# Patient Record
Sex: Male | Born: 1935 | Race: White | Hispanic: No | Smoking: Former smoker
Health system: Southern US, Community
[De-identification: ages and names within clinical notes are randomized; demographics above are authoritative.]

## PROBLEM LIST (undated history)

## (undated) DIAGNOSIS — I714 Abdominal aortic aneurysm, without rupture, unspecified: Secondary | ICD-10-CM

## (undated) DIAGNOSIS — R269 Unspecified abnormalities of gait and mobility: Secondary | ICD-10-CM

## (undated) DIAGNOSIS — Z951 Presence of aortocoronary bypass graft: Secondary | ICD-10-CM

## (undated) DIAGNOSIS — I10 Essential (primary) hypertension: Secondary | ICD-10-CM

## (undated) DIAGNOSIS — I639 Cerebral infarction, unspecified: Secondary | ICD-10-CM

## (undated) DIAGNOSIS — I509 Heart failure, unspecified: Secondary | ICD-10-CM

## (undated) DIAGNOSIS — E785 Hyperlipidemia, unspecified: Secondary | ICD-10-CM

## (undated) DIAGNOSIS — R413 Other amnesia: Secondary | ICD-10-CM

## (undated) DIAGNOSIS — K219 Gastro-esophageal reflux disease without esophagitis: Secondary | ICD-10-CM

## (undated) DIAGNOSIS — K319 Disease of stomach and duodenum, unspecified: Secondary | ICD-10-CM

## (undated) DIAGNOSIS — I2510 Atherosclerotic heart disease of native coronary artery without angina pectoris: Secondary | ICD-10-CM

## (undated) DIAGNOSIS — N189 Chronic kidney disease, unspecified: Secondary | ICD-10-CM

## (undated) DIAGNOSIS — M109 Gout, unspecified: Secondary | ICD-10-CM

## (undated) DIAGNOSIS — C801 Malignant (primary) neoplasm, unspecified: Secondary | ICD-10-CM

## (undated) DIAGNOSIS — M1990 Unspecified osteoarthritis, unspecified site: Secondary | ICD-10-CM

## (undated) DIAGNOSIS — I350 Nonrheumatic aortic (valve) stenosis: Secondary | ICD-10-CM

## (undated) HISTORY — DX: Abdominal aortic aneurysm, without rupture: I71.4

## (undated) HISTORY — DX: Presence of aortocoronary bypass graft: Z95.1

## (undated) HISTORY — DX: Abdominal aortic aneurysm, without rupture, unspecified: I71.40

## (undated) HISTORY — DX: Heart failure, unspecified: I50.9

## (undated) HISTORY — DX: Malignant (primary) neoplasm, unspecified: C80.1

## (undated) HISTORY — DX: Chronic kidney disease, unspecified: N18.9

## (undated) HISTORY — DX: Essential (primary) hypertension: I10

## (undated) HISTORY — DX: Gastro-esophageal reflux disease without esophagitis: K21.9

## (undated) HISTORY — DX: Gout, unspecified: M10.9

## (undated) HISTORY — DX: Unspecified osteoarthritis, unspecified site: M19.90

## (undated) HISTORY — DX: Atherosclerotic heart disease of native coronary artery without angina pectoris: I25.10

## (undated) HISTORY — DX: Cerebral infarction, unspecified: I63.9

## (undated) HISTORY — DX: Nonrheumatic aortic (valve) stenosis: I35.0

## (undated) HISTORY — DX: Hyperlipidemia, unspecified: E78.5

## (undated) HISTORY — DX: Disease of stomach and duodenum, unspecified: K31.9

## (undated) HISTORY — PX: ABDOMINAL AORTIC ANEURYSM REPAIR: SUR1152

## (undated) HISTORY — DX: Other amnesia: R41.3

## (undated) HISTORY — DX: Unspecified abnormalities of gait and mobility: R26.9

---

## 1992-03-06 HISTORY — PX: CORONARY ARTERY BYPASS GRAFT: SHX141

## 2008-03-06 HISTORY — PX: PROSTATECTOMY: SHX69

## 2009-03-06 HISTORY — PX: BYPASS GRAFT: SHX909

## 2010-03-06 HISTORY — PX: REPLACEMENT TOTAL KNEE: SUR1224

## 2012-03-06 HISTORY — PX: HERNIA REPAIR: SHX51

## 2012-10-10 ENCOUNTER — Other Ambulatory Visit: Payer: Self-pay | Admitting: *Deleted

## 2012-10-10 ENCOUNTER — Ambulatory Visit (INDEPENDENT_AMBULATORY_CARE_PROVIDER_SITE_OTHER): Payer: Medicare Other | Admitting: Cardiovascular Disease

## 2012-10-10 ENCOUNTER — Encounter: Payer: Self-pay | Admitting: Cardiovascular Disease

## 2012-10-10 VITALS — BP 118/74 | HR 62 | Ht 72.0 in | Wt 194.0 lb

## 2012-10-10 DIAGNOSIS — E785 Hyperlipidemia, unspecified: Secondary | ICD-10-CM

## 2012-10-10 DIAGNOSIS — Z9889 Other specified postprocedural states: Secondary | ICD-10-CM

## 2012-10-10 DIAGNOSIS — Z951 Presence of aortocoronary bypass graft: Secondary | ICD-10-CM

## 2012-10-10 DIAGNOSIS — I714 Abdominal aortic aneurysm, without rupture: Secondary | ICD-10-CM

## 2012-10-10 DIAGNOSIS — Z954 Presence of other heart-valve replacement: Secondary | ICD-10-CM

## 2012-10-10 DIAGNOSIS — I712 Thoracic aortic aneurysm, without rupture: Secondary | ICD-10-CM

## 2012-10-10 DIAGNOSIS — Z01810 Encounter for preprocedural cardiovascular examination: Secondary | ICD-10-CM

## 2012-10-10 DIAGNOSIS — Z952 Presence of prosthetic heart valve: Secondary | ICD-10-CM

## 2012-10-10 DIAGNOSIS — Z8679 Personal history of other diseases of the circulatory system: Secondary | ICD-10-CM | POA: Insufficient documentation

## 2012-10-10 DIAGNOSIS — I10 Essential (primary) hypertension: Secondary | ICD-10-CM

## 2012-10-10 DIAGNOSIS — Z01812 Encounter for preprocedural laboratory examination: Secondary | ICD-10-CM

## 2012-10-10 DIAGNOSIS — I70269 Atherosclerosis of native arteries of extremities with gangrene, unspecified extremity: Secondary | ICD-10-CM

## 2012-10-10 NOTE — Assessment & Plan Note (Signed)
Recent abdominal aortic areas measurements by CT scan suggest that it has grown over 2 cm in last 2 years now but over 7 cm in diameter. I'm going to refer him to Dr. Durene Cal for consideration of endoluminal stent grafting. The patient does not wish to have open aortobifemoral bypass grafting. He'll need a preoperative cardiovascular workup for clearance.

## 2012-10-10 NOTE — Progress Notes (Signed)
10/10/2012 Robert Mueller.   03-24-1931  [id number]  Primary Physician No primary provider on file. Primary Cardiologist: Runell Gess MD Roseanne Reno   HPI:  [name] is a 79 year old recently widowed Caucasian male (wife for 58 years recently passed away) father of 49 daughter-in-law of Lacinda Axon, one of my patients. The patient recently moved from [place] to live with his family. He is here to be established to my practice. He has a history of coronary artery bypass grafting in 1992 at [facility] in [place] with redo bypass grafting December 2011 at [facility] in [place]. He probably had an aortic valve replacement done simultaneously as well as a thoracic aortic aneurysm repair. His affect is include remote tobacco abuse having quit in 1992, hypertension and hyperlipidemia. He is a retired Hotel manager. He denies chest pain or shortness of breath. He has a large abdominal aortic areas and which has been followed in the past noninvasively currently measuring 7.5 cm by CT scanning.   Current Outpatient Prescriptions  Medication Sig Dispense Refill  . allopurinol (ZYLOPRIM) 100 MG tablet Take 100 mg by mouth daily.      Marland Kitchen amLODipine (NORVASC) 10 MG tablet Take 10 mg by mouth daily.      Marland Kitchen aspirin EC 81 MG tablet Take 81 mg by mouth daily.      Marland Kitchen atorvastatin (LIPITOR) 20 MG tablet Take 20 mg by mouth daily. Take 30mg  daily      . FLUoxetine (PROZAC) 10 MG capsule Take 10 mg by mouth daily.      Marland Kitchen lisinopril (PRINIVIL,ZESTRIL) 20 MG tablet Take 20 mg by mouth daily.      . multivitamin-lutein (OCUVITE-LUTEIN) CAPS capsule Take 1 capsule by mouth daily.      . pantoprazole (PROTONIX) 40 MG tablet Take 40 mg by mouth daily.      Marland Kitchen zolpidem (AMBIEN) 5 MG tablet Take 5 mg by mouth at bedtime as needed for sleep.       No current facility-administered medications for this visit.    Allergies  Allergen  Reactions  . Contrast Media (Iodinated Diagnostic Agents)   . Penicillins     Joint swelling    History   Social History  . Marital Status: Widowed    Spouse Name: N/A    Number of Children: N/A  . Years of Education: N/A   Occupational History  . Not on file.   Social History Main Topics  . Smoking status: Former Smoker -- 0.75 packs/day for 37 years    Types: Cigarettes    Quit date: 03/06/1990  . Smokeless tobacco: Not on file  . Alcohol Use: 7.0 oz/week    14 drink(s) per week  . Drug Use: No  . Sexually Active: Not on file   Other Topics Concern  . Not on file   Social History Narrative  . No narrative on file     Review of Systems: General: negative for chills, fever, night sweats or weight changes.  Cardiovascular: negative for chest pain, dyspnea on exertion, edema, orthopnea, palpitations, paroxysmal nocturnal dyspnea or shortness of breath Dermatological: negative for rash Respiratory: negative for cough or wheezing Urologic: negative for hematuria Abdominal: negative for nausea, vomiting, diarrhea, bright red blood per rectum, melena, or hematemesis Neurologic: negative for visual changes, syncope, or dizziness All other systems reviewed and are otherwise negative except as noted above.    Blood pressure 118/74, pulse 62, height 6' (1.829 m), weight  194 lb (87.998 kg).  General appearance: alert and no distress Neck: no adenopathy, no carotid bruit, no JVD, supple, symmetrical, trachea midline and thyroid not enlarged, symmetric, no tenderness/mass/nodules Lungs: clear to auscultation bilaterally Heart: regular rate and rhythm, S1, S2 normal, no murmur, click, rub or gallop Extremities: extremities normal, atraumatic, no cyanosis or edema Pulses: 2+ and symmetric  EKG normal sinus rhythm at 62 without ST or T wave changes  ASSESSMENT AND PLAN:   S/P CABG (coronary artery bypass graft) History of coronary artery bypass grafting at [facility] in 1992 with redo December 2011 at [facility] in [facility] with aortic valve replacement and thoracic aortic aneurysm repair. The patient denies chest pain or shortness of breath. He is never had a heart attack.  Abdominal aortic aneurysm Recent abdominal aortic areas measurements by CT scan suggest that it has grown over 2 cm in last 2 years now but over 7 cm in diameter. I'm going to refer him to Dr. Durene Cal for consideration of endoluminal stent grafting. The patient does not wish to have open aortobifemoral bypass grafting. He'll need a preoperative cardiovascular workup for clearance.      Runell Gess MD FACP,FACC,FAHA, [facility] 10/10/2012 1:15 PM

## 2012-10-10 NOTE — Assessment & Plan Note (Signed)
History of coronary artery bypass grafting at [facility] in 1992 with redo December 2011 at [facility] in [place] with aortic valve replacement and thoracic aortic aneurysm repair. The patient denies chest pain or shortness of breath. He is never had a heart attack.

## 2012-10-10 NOTE — Patient Instructions (Signed)
  We will see you back in follow up in 3 months with Dr Allyson Sabal  Dr Allyson Sabal has ordered an echocardiogram and a lexiscan myoview Your physician has requested that you have an echocardiogram. Echocardiography is a painless test that uses sound waves to create images of your heart. It provides your doctor with information about the size and shape of your heart and how well your heart's chambers and valves are working. This procedure takes approximately one hour. There are no restrictions for this procedure.  Your physician has requested that you have a lexiscan myoview. For further information please visit [email or web address] Please follow instruction sheet, as given.  We have referred you to Dr Myra Gianotti for evaluation of your AAA

## 2012-10-11 ENCOUNTER — Encounter: Payer: Self-pay | Admitting: Surgery

## 2012-10-14 ENCOUNTER — Ambulatory Visit (HOSPITAL_COMMUNITY): Payer: Medicare Other

## 2012-10-14 ENCOUNTER — Ambulatory Visit (INDEPENDENT_AMBULATORY_CARE_PROVIDER_SITE_OTHER): Payer: Medicare Other | Admitting: Surgery

## 2012-10-14 ENCOUNTER — Other Ambulatory Visit: Payer: Self-pay | Admitting: Surgery

## 2012-10-14 ENCOUNTER — Encounter: Payer: 59 | Admitting: Surgery

## 2012-10-14 ENCOUNTER — Encounter: Payer: Self-pay | Admitting: Surgery

## 2012-10-14 VITALS — BP 146/94 | HR 66 | Resp 16 | Ht 72.0 in | Wt 196.0 lb

## 2012-10-14 DIAGNOSIS — I714 Abdominal aortic aneurysm, without rupture, unspecified: Secondary | ICD-10-CM | POA: Insufficient documentation

## 2012-10-14 NOTE — Progress Notes (Signed)
[facility] [place]   Patient name: Robert Mueller. MRN: [id number] DOB: March 04, 1932 Sex: male   Referred by: Dr Robert Mueller  Reason for referral:  Chief Complaint  Patient presents with  . New Evaluation    7.3 to 7.5 cm  AAA Ref. by Dr. Erlene Mueller    HISTORY OF PRESENT ILLNESS: This is a 79 year old gentleman who recently moved from [place] to this area. He has a known a large juxtarenal abdominal aortic aneurysm. He comes in today for further evaluation. He brought a CAT scan from [place] which was a noncontrasted study which shows juxtarenal aneurysm. Apparently, this has grown 2 cm in the past 2 years and is now greater than 7 cm in diameter. He denies specific abdominal pain or back pain. In the past, he refused open surgery. He is now amenable to getting this taken care of because of how large it has gotten.  The patient has a significant cardiac history. He is undergone CABG at [facility] in 1992. 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 He underwent redo surgery in 2011 in [place] with aortic valve replacement and a descending aortic aneurysm repair.  The patient suffers from hypertension. He is on an ACE inhibitor. He is also managed for hypercholesterolemia with a statin. The patient does have a contrast allergy was consistent rash and joint pain.  Past Medical History  Diagnosis Date  . Coronary artery disease   . S/P CABG (coronary artery bypass graft)     x 2  . Hypertension   . Hyperlipidemia   . Abdominal aortic aneurysm     7.3 cm x 7.4 cm  . Stroke   . Cancer     prostate  . Stomach problems   . Arthritis     Gout  . GERD (gastroesophageal reflux disease)   . Mild aortic stenosis   . CHF (congestive heart failure)   . Chronic kidney disease     Stage II    Past Surgical History  Procedure Laterality Date  . Hernia repair      4  . Replacement total knee    . Prostatectomy    . Coronary artery bypass  graft    . Bypass graft      History   Social History  . Marital Status: Widowed    Spouse Name: N/A    Number of Children: 3  . Years of Education: 12   Occupational History  . Not on file.   Social History Main Topics  . Smoking status: Former Smoker -- 0.75 packs/day for 37 years    Types: Cigars    Quit date: 03/06/1988  . Smokeless tobacco: Former Neurosurgeon    Types: Chew  . Alcohol Use: 0.0 oz/week     Comment: 14-20 DRINKS WEEKLY OF GIN / SCOTCH  . Drug Use: No  . Sexually Active: Not on file   Other Topics Concern  . Not on file   Social History Narrative   LIVES WITH HIS DAUGHTER AND SON-IN-LAW   WALKS WITH A WALKER/CANE   DOES NOT CLEAN HOUSE   DOES NOT DO YARD WORK      DOES DO GROCERY SHOPPING    Family History  Problem Relation Age of Onset  . Cancer Mother   . Cirrhosis Neg Hx     family member passed away at 40    Allergies as of 10/14/2012 - Review Complete 10/14/2012  Allergen Reaction Noted  . Contrast  media (iodinated diagnostic agents)  10/10/2012  . Penicillins  10/10/2012    Current Outpatient Prescriptions on File Prior to Visit  Medication Sig Dispense Refill  . allopurinol (ZYLOPRIM) 100 MG tablet Take 100 mg by mouth daily.      Marland Kitchen amLODipine (NORVASC) 10 MG tablet Take 10 mg by mouth daily.      Marland Kitchen aspirin EC 81 MG tablet Take 81 mg by mouth daily.      Marland Kitchen atorvastatin (LIPITOR) 20 MG tablet Take 20 mg by mouth daily. Take 30mg  daily      . FLUoxetine (PROZAC) 10 MG capsule Take 10 mg by mouth daily.      Marland Kitchen lisinopril (PRINIVIL,ZESTRIL) 20 MG tablet Take 20 mg by mouth daily.      . multivitamin-lutein (OCUVITE-LUTEIN) CAPS capsule Take 1 capsule by mouth daily.      . pantoprazole (PROTONIX) 40 MG tablet Take 40 mg by mouth daily.      Marland Kitchen zolpidem (AMBIEN) 5 MG tablet Take 5 mg by mouth at bedtime as needed for sleep.       No current facility-administered medications on file prior to visit.     REVIEW OF SYSTEMS: Cardiovascular:  No chest pain, chest pressure, palpitations, orthopnea, or dyspnea on exertion. No claudication or rest pain, positive for occasional leg swelling Pulmonary: No productive cough, asthma or wheezing. Neurologic: No weakness, paresthesias, aphasia, or amaurosis. Occasional  dizziness. Hematologic: No bleeding problems or clotting disorders. Musculoskeletal: No joint pain or joint swelling. Gastrointestinal: No blood in stool or hematemesis Genitourinary: No dysuria or hematuria. Psychiatric:: No history of major depression. Integumentary: No rashes or ulcers. Constitutional: No fever or chills.  PHYSICAL EXAMINATION: General: The patient appears their stated age.  Vital signs are BP 146/94  Pulse 66  Resp 16  Ht 6' (1.829 m)  Wt 196 lb (88.905 kg)  BMI 26.58 kg/m2  SpO2 95% HEENT:  No gross abnormalities Pulmonary: Respirations are non-labored Abdomen: Soft and non-tender . Aorta is palpable. It is mildly tender Musculoskeletal: There are no major deformities.   Neurologic: No focal weakness or paresthesias are detected, Skin: There are no ulcer or rashes noted. Psychiatric: The patient has normal affect. Cardiovascular: There is a regular rate and rhythm without significant murmur appreciated. I cannot palpate pedal pulses  Diagnostic Studies: I have reviewed his outside CT scan. It is difficult to ascertain much on his aneurysm from his most recent scan because of the lack of contrast   Assessment:  Large abdominal aortic aneurysm Plan: I spent approximately 45 minutes discussing our options with the patient and his daughter. The patient does have a juxtarenal aneurysm and need either a fenestrated stent graft for an open repair. I am unsure whether or not the ZFEN device, which is the only device I had access to will treat his aneurysm. He needs a CT angiogram to help better make this decision. I also offered them the ability to go to [facility] to have this better evaluated as  they may be able to get a device created earlier than I can. From the ED turnaround time is approximately 5 weeks. We have decided to premedicate him for his dialysis he and get a CT angiogram tomorrow. I will contact them with the results and our plan.     Jorge Ny, M.D. [facility] [place] Office: [phone] Pager:  [phone]

## 2012-10-15 ENCOUNTER — Inpatient Hospital Stay: Admission: RE | Admit: 2012-10-15 | Payer: Medicare Other | Source: Ambulatory Visit

## 2012-10-15 ENCOUNTER — Ambulatory Visit
Admission: RE | Admit: 2012-10-15 | Discharge: 2012-10-15 | Disposition: A | Discharge status: Home / Self Care | Payer: Medicare Other | Source: Ambulatory Visit | Attending: Surgery | Admitting: Surgery

## 2012-10-15 DIAGNOSIS — I712 Thoracic aortic aneurysm, without rupture: Secondary | ICD-10-CM

## 2012-10-15 DIAGNOSIS — Z01810 Encounter for preprocedural cardiovascular examination: Secondary | ICD-10-CM

## 2012-10-15 DIAGNOSIS — I714 Abdominal aortic aneurysm, without rupture: Secondary | ICD-10-CM

## 2012-10-15 MED ORDER — IOHEXOL 350 MG/ML SOLN
100.0000 mL | Freq: Once | INTRAVENOUS | Status: AC | PRN
  Administered 2012-10-15: 100 mL via INTRAVENOUS

## 2012-10-17 ENCOUNTER — Ambulatory Visit (HOSPITAL_COMMUNITY)
Admission: RE | Admit: 2012-10-17 | Discharge: 2012-10-17 | Disposition: A | Discharge status: Home / Self Care | Payer: Medicare Other | Source: Ambulatory Visit | Attending: Cardiovascular Disease | Admitting: Cardiovascular Disease

## 2012-10-17 ENCOUNTER — Ambulatory Visit (HOSPITAL_BASED_OUTPATIENT_CLINIC_OR_DEPARTMENT_OTHER)
Admission: RE | Admit: 2012-10-17 | Discharge: 2012-10-17 | Disposition: A | Discharge status: Home / Self Care | Payer: Medicare Other | Source: Ambulatory Visit | Attending: Cardiovascular Disease | Admitting: Cardiovascular Disease

## 2012-10-17 ENCOUNTER — Encounter: Payer: Self-pay | Admitting: Cardiovascular Disease

## 2012-10-17 DIAGNOSIS — I10 Essential (primary) hypertension: Secondary | ICD-10-CM | POA: Insufficient documentation

## 2012-10-17 DIAGNOSIS — I712 Thoracic aortic aneurysm, without rupture, unspecified: Secondary | ICD-10-CM | POA: Insufficient documentation

## 2012-10-17 DIAGNOSIS — Z952 Presence of prosthetic heart valve: Secondary | ICD-10-CM

## 2012-10-17 DIAGNOSIS — I079 Rheumatic tricuspid valve disease, unspecified: Secondary | ICD-10-CM | POA: Insufficient documentation

## 2012-10-17 DIAGNOSIS — Z01810 Encounter for preprocedural cardiovascular examination: Secondary | ICD-10-CM | POA: Insufficient documentation

## 2012-10-17 DIAGNOSIS — I359 Nonrheumatic aortic valve disorder, unspecified: Secondary | ICD-10-CM

## 2012-10-17 DIAGNOSIS — I2510 Atherosclerotic heart disease of native coronary artery without angina pectoris: Secondary | ICD-10-CM

## 2012-10-17 DIAGNOSIS — E785 Hyperlipidemia, unspecified: Secondary | ICD-10-CM | POA: Insufficient documentation

## 2012-10-17 DIAGNOSIS — Z951 Presence of aortocoronary bypass graft: Secondary | ICD-10-CM

## 2012-10-17 DIAGNOSIS — I379 Nonrheumatic pulmonary valve disorder, unspecified: Secondary | ICD-10-CM | POA: Insufficient documentation

## 2012-10-17 MED ORDER — TECHNETIUM TC 99M SESTAMIBI GENERIC - CARDIOLITE
31.2000 | Freq: Once | INTRAVENOUS | Status: AC | PRN
  Administered 2012-10-17: 31.2 via INTRAVENOUS

## 2012-10-17 MED ORDER — TECHNETIUM TC 99M SESTAMIBI GENERIC - CARDIOLITE
10.5000 | Freq: Once | INTRAVENOUS | Status: AC | PRN
  Administered 2012-10-17: 11 via INTRAVENOUS

## 2012-10-17 MED ORDER — REGADENOSON 0.4 MG/5ML IV SOLN
0.4000 mg | Freq: Once | INTRAVENOUS | Status: AC
  Administered 2012-10-17: 0.4 mg via INTRAVENOUS

## 2012-10-17 NOTE — Procedures (Addendum)
[facility] [address] [place] [address] [phone]  Cardiology Nuclear Med Study  Robert Mueller. is a 79 y.o. male     MRN : [id number]     DOB: April 24, 1931  Procedure Date: 10/17/2012  Nuclear Med Background Indication for Stress Test:  Surgical Clearance and Graft Patency History:  CABG 1992, 2011 Cardiac Risk Factors: History of Smoking, Hypertension, Lipids, Obesity and PVD  Symptoms:  Fatigue   Nuclear Pre-Procedure Caffeine/Decaff Intake:  7:00pm NPO After: 5:30am   IV Site: R Antecubital  IV 0.9% NS with Angio Cath:  22g  Chest Size (in):  42 IV Started by: Koren Shiver, CNMT  Height: 6' (1.829 m)  Cup Size: n/a  BMI:  Body mass index is 26.31 kg/(m^2). Weight:  194 lb (87.998 kg)   Tech Comments:  n/a    Nuclear Med Study 1 or 2 day study: 1 day  Stress Test Type:  Lexiscan  Order Authorizing Provider:  Nanetta Batty, MD   Resting Radionuclide: Technetium 82m Sestamibi  Resting Radionuclide Dose: 10.5 mCi   Stress Radionuclide:  Technetium 68m Sestamibi  Stress Radionuclide Dose: 31.2 mCi           Stress Protocol Rest HR: 55 Stress HR:  58  Rest BP:  138/86 Stress BP:  123/87  Exercise Time (min): n/a METS: n/a   Predicted Max HR: 141 bpm % Max HR: 46.1 bpm Rate Pressure Product: 86578  Dose of Adenosine (mg):  n/a Dose of Lexiscan: 0.4 mg  Dose of Atropine (mg): n/a Dose of Dobutamine: n/a mcg/kg/min (at max HR)  Stress Test Technologist: Esperanza Sheets, CCT Nuclear Technologist: Gonzella Lex, CNMT   Rest Procedure:  Myocardial perfusion imaging was performed at rest 45 minutes following the intravenous administration of Technetium 65m Sestamibi. Stress Procedure:  The patient received IV Lexiscan 0.4 mg over 15-seconds.  Technetium 86m Sestamibi injected at 30-seconds.  There were no significant changes with Lexiscan.  Quantitative spect images were obtained after a 45 minute  delay.  Transient Ischemic Dilatation (Normal <1.22):  0.89 Lung/Heart Ratio (Normal <0.45):  0.21 QGS EDV:  99 ml QGS ESV:  44 ml LV Ejection Fraction: 55%     Rest ECG: NSR with non-specific ST-T wave changes  Stress ECG: There are scattered PVCs.  QPS Raw Data Images:  Normal; no motion artifact; normal heart/lung ratio. Stress Images:  There is decreased uptake in the inferior wall and apex. Rest Images:  There is decreased uptake in the inferior wall and apex. Subtraction (SDS):  There is a fixed defect that is most consistent with a previous infarction.  Impression Exercise Capacity:  Lexiscan with no exercise. BP Response:  Normal blood pressure response. Clinical Symptoms:  No significant symptoms noted. ECG Impression:  No significant ST segment change suggestive of ischemia. Comparison with Prior Nuclear Study: No previous nuclear study performed  Overall Impression:  Low risk stress nuclear study : inferior and apical scar without reversible ischemia.  LV Wall Motion:  Normal overall LV function with inferior hypokinesis and apical akinesis.   Thurmon Fair, MD  10/17/2012 12:36 PM

## 2012-10-17 NOTE — Progress Notes (Signed)
2D Echo Performed 10/17/2012    Erie Radu, RCS  

## 2012-10-21 ENCOUNTER — Encounter: Payer: Self-pay | Admitting: Cardiovascular Disease

## 2012-10-21 NOTE — Telephone Encounter (Signed)
Message forwarded to Medical Records to verify documents and contact pt to confirm.

## 2012-10-21 NOTE — Telephone Encounter (Signed)
Message forwarded to Medical Records to update info.

## 2012-10-23 ENCOUNTER — Encounter: Payer: Self-pay | Admitting: Cardiovascular Disease

## 2012-10-23 ENCOUNTER — Telehealth: Payer: Self-pay | Admitting: *Deleted

## 2012-10-23 NOTE — Telephone Encounter (Signed)
Call to pt and spoke w/ Tinnie Gens, pt's daughter.  Informed CT results have not been reviewed by Dr. Allyson Sabal and his nurse will be notified to release results in MyChart once reviewed.  Also informed no record of referral to [facility] and that pt was referred to Dr. Myra Gianotti.  Verbalized understanding.  Stated Dr. Myra Gianotti referred pt to a doctor in [facility] and they called to set up appt, but never gave phone number or address.  Contact number for Dr. Myra Gianotti given [phone]) and advised she contact his office for information r/t referral.  Verbalized understanding and agreed w/ plan.  Message forwarded to K. Petra Kuba, Charity fundraiser.

## 2012-10-23 NOTE — Telephone Encounter (Signed)
Error. See My Chart message

## 2012-10-24 DIAGNOSIS — I716 Thoracoabdominal aortic aneurysm, without rupture: Secondary | ICD-10-CM | POA: Insufficient documentation

## 2012-10-25 ENCOUNTER — Encounter: Payer: Self-pay | Admitting: Cardiovascular Disease

## 2012-10-29 ENCOUNTER — Telehealth: Payer: Self-pay | Admitting: *Deleted

## 2012-10-29 NOTE — Telephone Encounter (Signed)
I called and spoke with Jenne.  Results to [facility] and echo given.  Mr Wyss is in a study for his AAA graft at [facility].  I faxed myoview and echo results to Diane [phone], [phone], email [email or web address]

## 2013-01-09 ENCOUNTER — Other Ambulatory Visit: Payer: Self-pay

## 2013-02-24 ENCOUNTER — Emergency Department (HOSPITAL_COMMUNITY)
Admission: EM | Admit: 2013-02-24 | Discharge: 2013-02-24 | Disposition: A | Discharge status: Home / Self Care | Payer: Medicare Other | Attending: Emergency Medicine | Admitting: Emergency Medicine

## 2013-02-24 ENCOUNTER — Encounter (HOSPITAL_COMMUNITY): Payer: Self-pay | Admitting: Emergency Medicine

## 2013-02-24 ENCOUNTER — Emergency Department (HOSPITAL_COMMUNITY): Payer: Medicare Other

## 2013-02-24 DIAGNOSIS — Z8546 Personal history of malignant neoplasm of prostate: Secondary | ICD-10-CM | POA: Insufficient documentation

## 2013-02-24 DIAGNOSIS — D649 Anemia, unspecified: Secondary | ICD-10-CM | POA: Insufficient documentation

## 2013-02-24 DIAGNOSIS — M129 Arthropathy, unspecified: Secondary | ICD-10-CM | POA: Insufficient documentation

## 2013-02-24 DIAGNOSIS — E785 Hyperlipidemia, unspecified: Secondary | ICD-10-CM | POA: Insufficient documentation

## 2013-02-24 DIAGNOSIS — Z7982 Long term (current) use of aspirin: Secondary | ICD-10-CM | POA: Insufficient documentation

## 2013-02-24 DIAGNOSIS — Z9889 Other specified postprocedural states: Secondary | ICD-10-CM | POA: Insufficient documentation

## 2013-02-24 DIAGNOSIS — I129 Hypertensive chronic kidney disease with stage 1 through stage 4 chronic kidney disease, or unspecified chronic kidney disease: Secondary | ICD-10-CM | POA: Insufficient documentation

## 2013-02-24 DIAGNOSIS — I714 Abdominal aortic aneurysm, without rupture, unspecified: Secondary | ICD-10-CM | POA: Insufficient documentation

## 2013-02-24 DIAGNOSIS — R748 Abnormal levels of other serum enzymes: Secondary | ICD-10-CM | POA: Insufficient documentation

## 2013-02-24 DIAGNOSIS — Z888 Allergy status to other drugs, medicaments and biological substances status: Secondary | ICD-10-CM | POA: Insufficient documentation

## 2013-02-24 DIAGNOSIS — Z87891 Personal history of nicotine dependence: Secondary | ICD-10-CM | POA: Insufficient documentation

## 2013-02-24 DIAGNOSIS — R142 Eructation: Secondary | ICD-10-CM | POA: Insufficient documentation

## 2013-02-24 DIAGNOSIS — Z8719 Personal history of other diseases of the digestive system: Secondary | ICD-10-CM | POA: Insufficient documentation

## 2013-02-24 DIAGNOSIS — Z8673 Personal history of transient ischemic attack (TIA), and cerebral infarction without residual deficits: Secondary | ICD-10-CM | POA: Insufficient documentation

## 2013-02-24 DIAGNOSIS — R5381 Other malaise: Secondary | ICD-10-CM | POA: Insufficient documentation

## 2013-02-24 DIAGNOSIS — Z880 Allergy status to penicillin: Secondary | ICD-10-CM | POA: Insufficient documentation

## 2013-02-24 DIAGNOSIS — K5900 Constipation, unspecified: Secondary | ICD-10-CM | POA: Insufficient documentation

## 2013-02-24 DIAGNOSIS — R112 Nausea with vomiting, unspecified: Secondary | ICD-10-CM | POA: Insufficient documentation

## 2013-02-24 DIAGNOSIS — R141 Gas pain: Secondary | ICD-10-CM | POA: Insufficient documentation

## 2013-02-24 DIAGNOSIS — Z466 Encounter for fitting and adjustment of urinary device: Secondary | ICD-10-CM | POA: Insufficient documentation

## 2013-02-24 DIAGNOSIS — R109 Unspecified abdominal pain: Secondary | ICD-10-CM | POA: Insufficient documentation

## 2013-02-24 DIAGNOSIS — E876 Hypokalemia: Secondary | ICD-10-CM | POA: Insufficient documentation

## 2013-02-24 DIAGNOSIS — I2510 Atherosclerotic heart disease of native coronary artery without angina pectoris: Secondary | ICD-10-CM | POA: Insufficient documentation

## 2013-02-24 DIAGNOSIS — N189 Chronic kidney disease, unspecified: Secondary | ICD-10-CM | POA: Insufficient documentation

## 2013-02-24 DIAGNOSIS — Z951 Presence of aortocoronary bypass graft: Secondary | ICD-10-CM | POA: Insufficient documentation

## 2013-02-24 DIAGNOSIS — K219 Gastro-esophageal reflux disease without esophagitis: Secondary | ICD-10-CM | POA: Insufficient documentation

## 2013-02-24 DIAGNOSIS — I509 Heart failure, unspecified: Secondary | ICD-10-CM | POA: Insufficient documentation

## 2013-02-24 DIAGNOSIS — Z79899 Other long term (current) drug therapy: Secondary | ICD-10-CM | POA: Insufficient documentation

## 2013-02-24 DIAGNOSIS — I359 Nonrheumatic aortic valve disorder, unspecified: Secondary | ICD-10-CM | POA: Insufficient documentation

## 2013-02-24 DIAGNOSIS — R630 Anorexia: Secondary | ICD-10-CM | POA: Insufficient documentation

## 2013-02-24 DIAGNOSIS — Z4801 Encounter for change or removal of surgical wound dressing: Secondary | ICD-10-CM | POA: Insufficient documentation

## 2013-02-24 LAB — URINE MICROSCOPIC-ADD ON

## 2013-02-24 LAB — COMPREHENSIVE METABOLIC PANEL
AST: 53 U/L — ABNORMAL HIGH (ref 0–37)
BUN: 14 mg/dL (ref 6–23)
CO2: 24 mEq/L (ref 19–32)
Calcium: 8.5 mg/dL (ref 8.4–10.5)
Chloride: 101 mEq/L (ref 96–112)
Creatinine, Ser: 0.84 mg/dL (ref 0.50–1.35)
GFR calc non Af Amer: 81 mL/min — ABNORMAL LOW (ref >90)
Total Bilirubin: 0.9 mg/dL (ref 0.3–1.2)

## 2013-02-24 LAB — URINALYSIS, ROUTINE W REFLEX MICROSCOPIC
Nitrite: NEGATIVE
Protein, ur: 100 mg/dL — AB
Specific Gravity, Urine: 1.026 (ref 1.005–1.030)
Urobilinogen, UA: 1 mg/dL (ref 0.0–1.0)

## 2013-02-24 LAB — CBC WITH DIFFERENTIAL/PLATELET
Basophils Absolute: 0 10*3/uL (ref 0.0–0.1)
Basophils Relative: 0 % (ref 0–1)
Eosinophils Relative: 1 % (ref 0–5)
HCT: 30 % — ABNORMAL LOW (ref 39.0–52.0)
Hemoglobin: 10.4 g/dL — ABNORMAL LOW (ref 13.0–17.0)
Lymphocytes Relative: 8 % — ABNORMAL LOW (ref 12–46)
MCHC: 34.7 g/dL (ref 30.0–36.0)
MCV: 94 fL (ref 78.0–100.0)
Monocytes Absolute: 0.5 10*3/uL (ref 0.1–1.0)
Monocytes Relative: 6 % (ref 3–12)
Neutro Abs: 7.1 10*3/uL (ref 1.7–7.7)
RDW: 13.6 % (ref 11.5–15.5)

## 2013-02-24 MED ORDER — FLEET ENEMA 7-19 GM/118ML RE ENEM
1.0000 | ENEMA | Freq: Once | RECTAL | Status: AC
  Administered 2013-02-24: 1 via RECTAL
  Filled 2013-02-24: qty 1

## 2013-02-24 NOTE — ED Notes (Signed)
Pt had a special stent placed in his aorta 10 days ago and then was discharged 6 days ago.  No BM in 6 days and is not passing gas.  Pt is on asa and plavix.  No pain meds in 6 days.  Sent here to r/o obstruction.  Pt is to have urinary catheter discontinued today.  Pt with abdominal discomfort.

## 2013-02-24 NOTE — ED Notes (Signed)
Pt states he urinated on the toilet after foley was removed. Pt instructed to return to ED or call urologist if pt hasnt voided in next 6 hours.

## 2013-02-24 NOTE — ED Notes (Signed)
Pt tolerating oral fluids 

## 2013-02-24 NOTE — ED Provider Notes (Signed)
CSN: [id number]     Arrival date & time 02/24/13  1117 History   None    No chief complaint on file.   HPI  Robert Mueller. is a 79 y.o. male with a PMH of abdominal aortic aneurysm s/p stent, aortic stenosis, CAD s/p CABG x 2, CHF, HTN, HLD, stroke, prostate cancer, arthritis, GERD, and CKD who presents to the ED for evaluation of post-op problem.  History was provided by the patient.  Patient had an aortic stent placed on 02/19/13 at [facility] with Dr. Pattricia Boss.  He states that he has not had a bowel movement for the past 8 days.  He states that he was told that he would have a bowel movement in a few days after discharge.  Patient states that he has still not had a bowel movement.  He was passing gas initially but this has been less frequent.  He has had emesis the last few days with one episode today.  He describes generalized bloating and distension in his abdomen, which was worse a few days ago but has improved (and is still present).  He denies any anal leakage, rectal pain/bleeding.  He has tried MOM, Miralax, and Magnesium citrate with no relief.  He has not been taking narcotics for his post op pain.  He denies any hx of bowel obstruction or GI problems in the past.  Past abdominal surgeries include an appendectomy.  Incisions are healing well with no drainage of pus or spreading redness/swelling.  He denies any fevers.  No chest pain or SOB.  He states that other than the constipation, he has been doing well since his surgery.  He has been tired, but believes this is part of the recovery process.  He has a foley catheter in place and had an appointment scheduled today with urology to have it removed, however, they missed this appointment due to their ED visit today.     Past Medical History  Diagnosis Date  . Coronary artery disease   . S/P CABG (coronary artery bypass graft)     x 2  . Hypertension   . Hyperlipidemia   . Abdominal aortic aneurysm     7.3 cm x 7.4 cm  . Stroke   . Cancer      prostate  . Stomach problems   . Arthritis     Gout  . GERD (gastroesophageal reflux disease)   . Mild aortic stenosis   . CHF (congestive heart failure)   . Chronic kidney disease     Stage II   Past Surgical History  Procedure Laterality Date  . Hernia repair      4  . Replacement total knee    . Prostatectomy    . Coronary artery bypass graft    . Bypass graft     Family History  Problem Relation Age of Onset  . Cancer Mother   . Cirrhosis Neg Hx     family member passed away at 62   History  Substance Use Topics  . Smoking status: Former Smoker -- 0.75 packs/day for 37 years    Types: Cigars    Quit date: 03/06/1988  . Smokeless tobacco: Former Neurosurgeon    Types: Chew  . Alcohol Use: 0.0 oz/week     Comment: 14-20 DRINKS WEEKLY OF GIN / SCOTCH    Review of Systems  Constitutional: Positive for appetite change and fatigue. Negative for fever, chills, diaphoresis and activity change.  HENT: Negative for congestion and  rhinorrhea.   Respiratory: Negative for cough, shortness of breath and wheezing.   Cardiovascular: Negative for chest pain and leg swelling.  Gastrointestinal: Positive for nausea, vomiting, abdominal pain, constipation and abdominal distention. Negative for diarrhea, blood in stool, anal bleeding and rectal pain.  Genitourinary: Negative for dysuria.  Musculoskeletal: Negative for back pain, gait problem, myalgias and neck pain.  Skin: Positive for wound (post-op).  Neurological: Negative for dizziness, syncope, weakness, light-headedness and headaches.    Allergies  Penicillins and Contrast media  Home Medications   Current Outpatient Rx  Name  Route  Sig  Dispense  Refill  . allopurinol (ZYLOPRIM) 100 MG tablet   Oral   Take 100 mg by mouth daily.         Marland Kitchen amLODipine (NORVASC) 10 MG tablet   Oral   Take 10 mg by mouth daily.         Marland Kitchen aspirin EC 81 MG tablet   Oral   Take 81 mg by mouth daily.         Marland Kitchen atorvastatin  (LIPITOR) 20 MG tablet   Oral   Take 20 mg by mouth daily. Take 30mg  daily         . FLUoxetine (PROZAC) 10 MG capsule   Oral   Take 10 mg by mouth daily.         . furosemide (LASIX) 20 MG tablet   Oral   Take 20 mg by mouth.         Marland Kitchen lisinopril (PRINIVIL,ZESTRIL) 20 MG tablet   Oral   Take 20 mg by mouth daily.         . multivitamin-lutein (OCUVITE-LUTEIN) CAPS capsule   Oral   Take 1 capsule by mouth daily.         . pantoprazole (PROTONIX) 40 MG tablet   Oral   Take 40 mg by mouth daily.         Marland Kitchen zolpidem (AMBIEN) 5 MG tablet   Oral   Take 5 mg by mouth at bedtime as needed for sleep.          BP 116/65  Pulse 85  Temp(Src) 98.4 F (36.9 C) (Oral)  Resp 18  Ht 6' (1.829 m)  Wt 200 lb (90.719 kg)  BMI 27.12 kg/m2  SpO2 98%  Filed Vitals:   02/24/13 1345 02/24/13 1430 02/24/13 1500 02/24/13 1610  BP: 125/72 133/70 137/66 116/75  Pulse: 81 73 72 80  Temp:      TempSrc:      Resp: 13 15 10 16   Height:      Weight:      SpO2: 98% 100% 100% 100%    Physical Exam  Nursing note and vitals reviewed. Constitutional: He is oriented to person, place, and time. He appears well-developed and well-nourished. No distress.  HENT:  Head: Normocephalic and atraumatic.  Right Ear: External ear normal.  Left Ear: External ear normal.  Nose: Nose normal.  Mouth/Throat: Oropharynx is clear and moist.  Eyes: Conjunctivae are normal. Pupils are equal, round, and reactive to light. Right eye exhibits no discharge. Left eye exhibits no discharge.  Neck: Normal range of motion. Neck supple.  Cardiovascular: Normal rate, regular rhythm, normal heart sounds and intact distal pulses.  Exam reveals no gallop and no friction rub.   No murmur heard. Dorsalis pedis pulses present and equal bilaterally.    Pulmonary/Chest: Effort normal and breath sounds normal. No respiratory distress. He has no wheezes. He has no  rales. He exhibits no tenderness.  Abdominal: Soft.  Bowel sounds are normal. He exhibits no distension and no mass. There is no tenderness. There is no rebound and no guarding.  Genitourinary:  Foley catheter in place  Musculoskeletal: Normal range of motion. He exhibits no edema and no tenderness.  Neurological: He is alert and oriented to person, place, and time.  Skin: Skin is warm and dry. He is not diaphoretic.     Post-op wounds in the left & right groin and left posterior humerus clean/dry/intact.  No surrounding erythema or drainage.      ED Course  Procedures (including critical care time) Labs Review Labs Reviewed  CBC WITH DIFFERENTIAL  COMPREHENSIVE METABOLIC PANEL  URINALYSIS, ROUTINE W REFLEX MICROSCOPIC   Imaging Review No results found.  EKG Interpretation   None      Results for orders placed during the hospital encounter of 02/24/13  URINE CULTURE      Result Value Range   Specimen Description URINE, CATHETERIZED     Special Requests NONE     Culture  Setup Time       Value: 02/24/2013 13:25     Performed at [facility] Count       Value: NO GROWTH     Performed at [facility]   Culture       Value: NO GROWTH     Performed at [facility]   Report Status 02/25/2013 FINAL    CBC WITH DIFFERENTIAL      Result Value Range   WBC 8.3  4.0 - 10.5 K/uL   RBC 3.19 (*) 4.22 - 5.81 MIL/uL   Hemoglobin 10.4 (*) 13.0 - 17.0 g/dL   HCT 40.9 (*) 81.1 - 91.4 %   MCV 94.0  78.0 - 100.0 fL   MCH 32.6  26.0 - 34.0 pg   MCHC 34.7  30.0 - 36.0 g/dL   RDW 78.2  95.6 - 21.3 %   Platelets 313  150 - 400 K/uL   Neutrophils Relative % 85 (*) 43 - 77 %   Neutro Abs 7.1  1.7 - 7.7 K/uL   Lymphocytes Relative 8 (*) 12 - 46 %   Lymphs Abs 0.7  0.7 - 4.0 K/uL   Monocytes Relative 6  3 - 12 %   Monocytes Absolute 0.5  0.1 - 1.0 K/uL   Eosinophils Relative 1  0 - 5 %   Eosinophils Absolute 0.1  0.0 - 0.7 K/uL   Basophils Relative 0  0 - 1 %   Basophils Absolute 0.0  0.0 - 0.1 K/uL   COMPREHENSIVE METABOLIC PANEL      Result Value Range   Sodium 138  135 - 145 mEq/L   Potassium 3.1 (*) 3.5 - 5.1 mEq/L   Chloride 101  96 - 112 mEq/L   CO2 24  19 - 32 mEq/L   Glucose, Bld 120 (*) 70 - 99 mg/dL   BUN 14  6 - 23 mg/dL   Creatinine, Ser 0.86  0.50 - 1.35 mg/dL   Calcium 8.5  8.4 - 57.8 mg/dL   Total Protein 6.5  6.0 - 8.3 g/dL   Albumin 2.9 (*) 3.5 - 5.2 g/dL   AST 53 (*) 0 - 37 U/L   ALT 106 (*) 0 - 53 U/L   Alkaline Phosphatase 574 (*) 39 - 117 U/L   Total Bilirubin 0.9  0.3 - 1.2 mg/dL   GFR calc non  Af Amer 81 (*) >90 mL/min   GFR calc Af Amer >90  >90 mL/min  URINALYSIS, ROUTINE W REFLEX MICROSCOPIC      Result Value Range   Color, Urine AMBER (*) YELLOW   APPearance HAZY (*) CLEAR   Specific Gravity, Urine 1.026  1.005 - 1.030   pH 6.5  5.0 - 8.0   Glucose, UA NEGATIVE  NEGATIVE mg/dL   Hgb urine dipstick LARGE (*) NEGATIVE   Bilirubin Urine MODERATE (*) NEGATIVE   Ketones, ur 15 (*) NEGATIVE mg/dL   Protein, ur 409 (*) NEGATIVE mg/dL   Urobilinogen, UA 1.0  0.0 - 1.0 mg/dL   Nitrite NEGATIVE  NEGATIVE   Leukocytes, UA SMALL (*) NEGATIVE  URINE MICROSCOPIC-ADD ON      Result Value Range   Squamous Epithelial / LPF FEW (*) RARE   WBC, UA 3-6  <3 WBC/hpf   RBC / HPF 11-20  <3 RBC/hpf   Bacteria, UA FEW (*) RARE   Casts GRANULAR CAST (*) NEGATIVE   Crystals CA OXALATE CRYSTALS (*) NEGATIVE   Urine-Other MUCOUS PRESENT    OCCULT BLOOD, POC DEVICE      Result Value Range   Fecal Occult Bld NEGATIVE  NEGATIVE    DG Abd Acute W/Chest (Final result)  Result time: 02/24/13 13:01:49    Final result by Rad Results In Interface (02/24/13 13:01:49)    Narrative:   CLINICAL DATA: Possible bowel obstruction. Abdominal distention, pain.  EXAM: ACUTE ABDOMEN SERIES (ABDOMEN 2 VIEW & CHEST 1 VIEW)  COMPARISON: Chest CT and abdominal CT 10/15/2012  FINDINGS: Prior median sternotomy. Mild hyperinflation. Stent graft repair of abdominal aortic scratch  has stent graft repair of abdominal aorta. Heart is normal size. Left basilar opacity likely reflect scarring or atelectasis. Possible small left effusion.  Nonobstructive bowel gas pattern. No evidence of bowel obstruction. No free air organomegaly. No acute bony abnormality.  IMPRESSION: Prior stent graft repair of abdominal aorta.  Left base atelectasis or scarring. Small left pleural effusion. Mild hyperinflation.   Electronically Signed By: Charlett Nose M.D. On: 02/24/2013 13:01         MDM   Robert Mueller. is a 79 y.o. male with a PMH of abdominal aortic aneurysm s/p stent, aortic stenosis, CAD s/p CABG x 2, CHF, HTN, HLD, stroke, prostate cancer, arthritis, GERD, and CKD who presents to the ED for evaluation of post-op problem   Rechecks  1:45 PM = Rectal exam performed at bedside with RN staff present.  Small round stools palpated in the rectal vault.  No fecal impaction.  No hemorrhoids or fissures.  No gross blood.   3:45 PM = Patient states he feels much better after his enema.  Had three small round stools.  Bloating and abdominal pain has resolved.   4:15 PM = Patient able to urinate after foley removal.  Patient dressed and ready for discharge.      Patient evaluated in the ED for post-op constipation after a AAA repair with stent 6 days ago.  Patient given enema and had bowel movement in the ED with relief in his abdominal pain and bloating.  No evidence of bowel obstruction on x-rays. No significant stool burden.  Abdominal exam benign.  Hemoccult negative.  Foley catheter removed in the ED, which was scheduled to be removed by urology today however they missed their appointment due to their ED visit.  Patient able to void after removal.  Patient found to have anemia, hypokalemia, and elevated  liver enzymes.  Patient encouraged to eat a diet rich in potassium for re-supplementation.  Anemia likely post-op and is stable.  Patient informed of elevated liver enzymes  and instructed to follow-up with PCP regarding this.  Patient encouraged to continue to take Miralax twice daily and drink plenty of fluids.  Will follow-up with vascular surgery and PCP.  Return precautions, discharge instructions, and follow-up was discussed with the patient and family before discharge.  Patient and family in agreement with discharge and plan.     Discharge Medication List as of 02/24/2013  4:01 PM      Final impressions: 1. Constipation   2. Hypokalemia   3. Anemia   4. Elevated liver enzymes      Luiz Iron PA-C   This patient was discussed with Dr. Clayborne Dana, PA-C 02/26/13 [phone]

## 2013-02-24 NOTE — ED Notes (Signed)
Patient is back from X-ray.

## 2013-02-24 NOTE — ED Notes (Signed)
Pt given enema, and requested to be placed on toilet. Pt used toilet, stated that he moved "three little turds" about the size of a quarter. Pt states his abdomen feels "much better".

## 2013-02-25 LAB — URINE CULTURE
Colony Count: NO GROWTH
Culture: NO GROWTH

## 2013-02-28 DIAGNOSIS — K449 Diaphragmatic hernia without obstruction or gangrene: Secondary | ICD-10-CM | POA: Insufficient documentation

## 2013-03-02 NOTE — ED Provider Notes (Signed)
Medical screening examination/treatment/procedure(s) were performed by non-physician practitioner and as supervising physician I was immediately available for consultation/collaboration.   Keeli Roberg L Naveen Clardy, MD 03/02/13 2103 

## 2013-04-01 ENCOUNTER — Non-Acute Institutional Stay (SKILLED_NURSING_FACILITY): Payer: Medicare Other | Admitting: Internal Medicine

## 2013-04-01 ENCOUNTER — Other Ambulatory Visit: Payer: Self-pay | Admitting: Internal Medicine

## 2013-04-01 ENCOUNTER — Encounter: Payer: Self-pay | Admitting: Internal Medicine

## 2013-04-01 DIAGNOSIS — Z951 Presence of aortocoronary bypass graft: Secondary | ICD-10-CM

## 2013-04-01 DIAGNOSIS — I714 Abdominal aortic aneurysm, without rupture, unspecified: Secondary | ICD-10-CM

## 2013-04-01 DIAGNOSIS — F329 Major depressive disorder, single episode, unspecified: Secondary | ICD-10-CM

## 2013-04-01 DIAGNOSIS — K449 Diaphragmatic hernia without obstruction or gangrene: Secondary | ICD-10-CM

## 2013-04-01 DIAGNOSIS — I10 Essential (primary) hypertension: Secondary | ICD-10-CM

## 2013-04-01 DIAGNOSIS — Z952 Presence of prosthetic heart valve: Secondary | ICD-10-CM

## 2013-04-01 DIAGNOSIS — K922 Gastrointestinal hemorrhage, unspecified: Secondary | ICD-10-CM

## 2013-04-01 DIAGNOSIS — Z931 Gastrostomy status: Secondary | ICD-10-CM

## 2013-04-01 DIAGNOSIS — E785 Hyperlipidemia, unspecified: Secondary | ICD-10-CM

## 2013-04-01 DIAGNOSIS — Z954 Presence of other heart-valve replacement: Secondary | ICD-10-CM

## 2013-04-01 DIAGNOSIS — I716 Thoracoabdominal aortic aneurysm, without rupture, unspecified: Secondary | ICD-10-CM

## 2013-04-01 NOTE — Assessment & Plan Note (Signed)
Secondary to death of wife of 60 years this summer and all the recent procedures; seemed to be improving before d/c but prozac was inc to 40 mg daily with melatonin for sleep

## 2013-04-01 NOTE — Assessment & Plan Note (Signed)
Continue Lipitor 20 mg;no recent FLP;recent LFT's nl

## 2013-04-01 NOTE — Assessment & Plan Note (Addendum)
Continue Lisinopril 20 mg BID and metoprolol was restarted just prior to d/c 12.5 mg BId;apparently pt was on Norvasc which was held still onn D/C but if/when BP recovers he can be started back on that

## 2013-04-01 NOTE — Assessment & Plan Note (Addendum)
Tolerating tube feeds fine;po is starting to improve-soft mechanical Tube feed 168ml/hr 6p-8a Some redness around tube just prior to d/c - Clinda 300 mg TID for 5 days

## 2013-04-01 NOTE — Assessment & Plan Note (Deleted)
Chronic paraesophageal hernia -repaired 12/30 acutely for N/V after PEG tube was placed.

## 2013-04-01 NOTE — Assessment & Plan Note (Signed)
Presentation was with clots per rectum;colonoscopy 1/16 showed AVM, diverticuli and friable mucosa; none were actively bleeding and the AVM has 2 clips placed protonix 40 mg daily-po or per tube, oxycodone for pain;weekly CBC for 4 weeks

## 2013-04-01 NOTE — Progress Notes (Signed)
MRN: [id number] Name: Shiloh Swopes.  Sex: male Age: 80 y.o. DOB: 11-Sep-1931  [place] #: Karren Burly Facility/Room: 120a Level Of Care: SNF Provider: Inocencio Homes D Emergency Contacts: Extended Emergency Contact Information Primary Emergency Contact: McNamara,Jennie  [place] Phone: [phone] Relation: Daughter  Code Status: FULL  Allergies: Amoxicillin; Metrizamide; Penicillins; and Contrast media  Chief Complaint  Patient presents with  . nursing home admission    HPI: Patient is 80 y.o. male who  Past Medical History  Diagnosis Date  . Coronary artery disease   . S/P CABG (coronary artery bypass graft)     x 2  . Hypertension   . Hyperlipidemia   . Abdominal aortic aneurysm     7.3 cm x 7.4 cm  . Stroke   . Cancer     prostate  . Stomach problems   . Arthritis     Gout  . GERD (gastroesophageal reflux disease)   . Mild aortic stenosis   . CHF (congestive heart failure)   . Chronic kidney disease     Stage II    Past Surgical History  Procedure Laterality Date  . Hernia repair      4  . Replacement total knee    . Prostatectomy    . Coronary artery bypass graft    . Bypass graft        Medication List       This list is accurate as of: 04/01/13 11:58 AM.  Always use your most recent med list.               allopurinol 100 MG tablet  Commonly known as:  ZYLOPRIM  Take 100 mg by mouth 2 (two) times daily.     aspirin EC 81 MG tablet  Take 81 mg by mouth daily.     atorvastatin 20 MG tablet  Commonly known as:  LIPITOR  Take 20 mg by mouth at bedtime.     clindamycin 300 MG capsule  Commonly known as:  CLEOCIN  Take 300 mg by mouth 3 (three) times daily.     clopidogrel 75 MG tablet  Commonly known as:  PLAVIX  Take 75 mg by mouth daily with breakfast.     docusate 50 MG/5ML liquid  Commonly known as:  COLACE  Take 100 mg by mouth 2 (two) times daily.     FLUoxetine 20 MG/5ML solution  Commonly known as:   PROZAC  Take 40 mg by mouth daily.     lisinopril 20 MG tablet  Commonly known as:  PRINIVIL,ZESTRIL  Take 20 mg by mouth 2 (two) times daily.     MELATONIN PO  Take 6 mg by mouth at bedtime.     metoprolol tartrate 12.5 mg Tabs tablet  Commonly known as:  LOPRESSOR  Take 12.5 mg by mouth 2 (two) times daily.     multivitamin-lutein Caps capsule  Take 1 capsule by mouth daily.     oxyCODONE 20 MG/ML concentrated solution  Commonly known as:  ROXICODONE INTENSOL  Take 5 mg by mouth every 6 (six) hours as needed for severe pain.     oxyCODONE-acetaminophen 5-325 MG per tablet  Commonly known as:  PERCOCET/ROXICET  Take 2 tablets by mouth every 4 (four) hours as needed for severe pain.     pantoprazole 40 MG tablet  Commonly known as:  PROTONIX  Take 40 mg by mouth daily.        Meds ordered this encounter  Medications  . clindamycin (CLEOCIN) 300 MG capsule    Sig: Take 300 mg by mouth 3 (three) times daily.  Marland Kitchen FLUoxetine (PROZAC) 20 MG/5ML solution    Sig: Take 40 mg by mouth daily.  Marland Kitchen docusate (COLACE) 50 MG/5ML liquid    Sig: Take 100 mg by mouth 2 (two) times daily.  . metoprolol tartrate (LOPRESSOR) 12.5 mg TABS tablet    Sig: Take 12.5 mg by mouth 2 (two) times daily.  Marland Kitchen oxyCODONE (ROXICODONE INTENSOL) 20 MG/ML concentrated solution    Sig: Take 5 mg by mouth every 6 (six) hours as needed for severe pain.  Marland Kitchen lisinopril (PRINIVIL,ZESTRIL) 20 MG tablet    Sig: Take 20 mg by mouth 2 (two) times daily.     There is no immunization history on file for this patient.  History  Substance Use Topics  . Smoking status: Former Smoker -- 0.75 packs/day for 37 years    Types: Cigars    Quit date: 03/06/1988  . Smokeless tobacco: Former Systems developer    Types: Chew  . Alcohol Use: 0.0 oz/week     Comment: 14-20 DRINKS WEEKLY OF GIN / SCOTCH    Family history is noncontributory    Review of Systems  DATA OBTAINED: from patient GENERAL: Feels well no fevers, fatigue,  appetite changes SKIN: No itching, rash EYES: No eye pain, redness, discharge EARS: No earache, tinnitus, change in hearing NOSE: No congestion, drainage or bleeding  MOUTH/THROAT: No mouth or tooth pain, No sore throat, No difficulty chewing or swallowing  RESPIRATORY: No cough, wheezing, SOB CARDIAC: No chest pain, palpitations, lower extremity edema  GI: No abdominal pain, No N/V/D or constipation, No heartburn or reflux  GU: No dysuria, frequency or urgency, or incontinence  MUSCULOSKELETAL: No unrelieved bone/joint pain NEUROLOGIC: No headache, dizziness or focal weakness PSYCHIATRIC: + sadness. Sleeps OK No behavior issue.   Filed Vitals:   04/01/13 1005  BP: 156/88  Pulse: 84  Temp: 97 F (36.1 C)  Resp: 22    Physical Exam  GENERAL APPEARANCE: Alert, conversant. Appropriately groomed. No acute distress.  SKIN: No diaphoresis rash HEAD: Normocephalic, atraumatic  EYES: Conjunctiva/lids clear. Pupils round, reactive. EOMs intact.  EARS: External exam WNL, canals clear. Hearing grossly normal.  NOSE: No deformity or discharge.  MOUTH/THROAT: Lips w/o lesions. RESPIRATORY: Breathing is even, unlabored. Lung sounds are clear   CARDIOVASCULAR: Heart RRR 2/6 systolic murmur, no rubs or gallops. No peripheral edema.   GASTROINTESTINAL: Abdomen is soft, non-tender, not distended w/ normal bowel sounds. G tube in place GENITOURINARY: Bladder non tender, not distended  MUSCULOSKELETAL: No abnormal joints or musculature NEUROLOGIC: Oriented X3. Cranial nerves 2-12 grossly intact. Moves all extremities no tremor. PSYCHIATRIC: Depressed affect, no behavioral issues  Patient Active Problem List   Diagnosis Date Noted  . Major depressive disorder, single episode 04/01/2013  . Lower GI bleed 04/01/2013  . PEG (percutaneous endoscopic gastrostomy) status 04/01/2013  . Hx of CABG 04/01/2013  . Diaphragmatic hernia 02/28/2013  . Thoracoabdominal aneurysm 10/24/2012  . Abdominal  aneurysm without mention of rupture 10/14/2012  . S/P CABG (coronary artery bypass graft) 10/10/2012  . H/O aortic valve replacement 10/10/2012  . H/O thoracic aortic aneurysm repair 10/10/2012  . Abdominal aortic aneurysm 10/10/2012  . Essential hypertension 10/10/2012  . Hyperlipidemia 10/10/2012    CBC    Component Value Date/Time   WBC 8.3 02/24/2013 1211   RBC 3.19* 02/24/2013 1211   HGB 10.4* 02/24/2013 1211   HCT 30.0* 02/24/2013  1211   PLT 313 02/24/2013 1211   MCV 94.0 02/24/2013 1211   LYMPHSABS 0.7 02/24/2013 1211   MONOABS 0.5 02/24/2013 1211   EOSABS 0.1 02/24/2013 1211   BASOSABS 0.0 02/24/2013 1211    CMP     Component Value Date/Time   NA 138 02/24/2013 1211   K 3.1* 02/24/2013 1211   CL 101 02/24/2013 1211   CO2 24 02/24/2013 1211   GLUCOSE 120* 02/24/2013 1211   BUN 14 02/24/2013 1211   CREATININE 0.84 02/24/2013 1211   CREATININE 0.89 10/14/2012 1409   CALCIUM 8.5 02/24/2013 1211   PROT 6.5 02/24/2013 1211   ALBUMIN 2.9* 02/24/2013 1211   AST 53* 02/24/2013 1211   ALT 106* 02/24/2013 1211   ALKPHOS 574* 02/24/2013 1211   BILITOT 0.9 02/24/2013 1211   GFRNONAA 81* 02/24/2013 1211   GFRAA >90 02/24/2013 1211    Assessment and Plan  Lower GI bleed Presentation was with clots per rectum;colonoscopy 1/16 showed AVM, diverticuli and friable mucosa; none were actively bleeding and the AVM has 2 clips placed protonix 40 mg daily-po or per tube, oxycodone for pain;weekly CBC for 4 weeks  Abdominal aneurysm without mention of rupture Pt seen by cards-repair is good, no fistulas per CT chest so pt started back on plavix and ASA 81 mg; continue statin;CBC weekly for 4 weeks per me to follow and trouble shoot  PEG (percutaneous endoscopic gastrostomy) status Tolerating tube feeds fine;po is starting to improve-soft mechanical Tube feed 138ml/hr 6p-8a Some redness around tube just prior to d/c - Clinda 300 mg TID for 5 days  Essential  hypertension Continue Lisinopril 20 mg BID and metoprolol was restarted just prior to d/c 12.5 mg BId;apparently pt was on Norvasc which was held still onn D/C but if/when BP recovers he can be started back on that  Major depressive disorder, single episode Secondary to death of wife of 9 years this summer and all the recent procedures; seemed to be improving before d/c but prozac was inc to 40 mg daily with melatonin for sleep  Hyperlipidemia Continue Lipitor 20 mg;no recent FLP;recent LFT's nl  H/O aortic valve replacement Pt is on ASA 81 mg and plavix 75 mg    Hennie Duos, MD

## 2013-04-01 NOTE — Assessment & Plan Note (Signed)
Pt seen by cards-repair is good, no fistulas per CT chest so pt started back on plavix and ASA 81 mg; continue statin;CBC weekly for 4 weeks per me to follow and trouble shoot

## 2013-04-01 NOTE — Assessment & Plan Note (Signed)
Pt is on ASA 81 mg and plavix 75 mg

## 2013-04-03 ENCOUNTER — Emergency Department (HOSPITAL_COMMUNITY)
Admission: EM | Admit: 2013-04-03 | Discharge: 2013-04-03 | Disposition: A | Discharge status: Home / Self Care | Payer: Medicare Other | Attending: Emergency Medicine | Admitting: Emergency Medicine

## 2013-04-03 ENCOUNTER — Other Ambulatory Visit (HOSPITAL_COMMUNITY): Payer: Medicare Other

## 2013-04-03 ENCOUNTER — Ambulatory Visit (HOSPITAL_COMMUNITY)
Admission: RE | Admit: 2013-04-03 | Discharge: 2013-04-03 | Disposition: A | Discharge status: Home / Self Care | Payer: Medicare Other | Source: Ambulatory Visit | Attending: Internal Medicine | Admitting: Internal Medicine

## 2013-04-03 ENCOUNTER — Encounter (HOSPITAL_COMMUNITY): Payer: Self-pay | Admitting: Emergency Medicine

## 2013-04-03 ENCOUNTER — Other Ambulatory Visit: Payer: Self-pay | Admitting: *Deleted

## 2013-04-03 ENCOUNTER — Other Ambulatory Visit: Payer: Self-pay | Admitting: Internal Medicine

## 2013-04-03 DIAGNOSIS — N182 Chronic kidney disease, stage 2 (mild): Secondary | ICD-10-CM | POA: Insufficient documentation

## 2013-04-03 DIAGNOSIS — K219 Gastro-esophageal reflux disease without esophagitis: Secondary | ICD-10-CM | POA: Insufficient documentation

## 2013-04-03 DIAGNOSIS — Z7901 Long term (current) use of anticoagulants: Secondary | ICD-10-CM | POA: Insufficient documentation

## 2013-04-03 DIAGNOSIS — Z431 Encounter for attention to gastrostomy: Secondary | ICD-10-CM | POA: Insufficient documentation

## 2013-04-03 DIAGNOSIS — Z951 Presence of aortocoronary bypass graft: Secondary | ICD-10-CM | POA: Insufficient documentation

## 2013-04-03 DIAGNOSIS — K9429 Other complications of gastrostomy: Secondary | ICD-10-CM | POA: Insufficient documentation

## 2013-04-03 DIAGNOSIS — I509 Heart failure, unspecified: Secondary | ICD-10-CM | POA: Insufficient documentation

## 2013-04-03 DIAGNOSIS — Z7982 Long term (current) use of aspirin: Secondary | ICD-10-CM | POA: Insufficient documentation

## 2013-04-03 DIAGNOSIS — I359 Nonrheumatic aortic valve disorder, unspecified: Secondary | ICD-10-CM | POA: Insufficient documentation

## 2013-04-03 DIAGNOSIS — Z8546 Personal history of malignant neoplasm of prostate: Secondary | ICD-10-CM | POA: Insufficient documentation

## 2013-04-03 DIAGNOSIS — Z880 Allergy status to penicillin: Secondary | ICD-10-CM | POA: Insufficient documentation

## 2013-04-03 DIAGNOSIS — Z87891 Personal history of nicotine dependence: Secondary | ICD-10-CM | POA: Insufficient documentation

## 2013-04-03 DIAGNOSIS — Z79899 Other long term (current) drug therapy: Secondary | ICD-10-CM | POA: Insufficient documentation

## 2013-04-03 DIAGNOSIS — I129 Hypertensive chronic kidney disease with stage 1 through stage 4 chronic kidney disease, or unspecified chronic kidney disease: Secondary | ICD-10-CM | POA: Insufficient documentation

## 2013-04-03 DIAGNOSIS — Z8673 Personal history of transient ischemic attack (TIA), and cerebral infarction without residual deficits: Secondary | ICD-10-CM | POA: Insufficient documentation

## 2013-04-03 DIAGNOSIS — R1310 Dysphagia, unspecified: Secondary | ICD-10-CM

## 2013-04-03 DIAGNOSIS — Z4659 Encounter for fitting and adjustment of other gastrointestinal appliance and device: Secondary | ICD-10-CM

## 2013-04-03 DIAGNOSIS — M129 Arthropathy, unspecified: Secondary | ICD-10-CM | POA: Insufficient documentation

## 2013-04-03 DIAGNOSIS — I2510 Atherosclerotic heart disease of native coronary artery without angina pectoris: Secondary | ICD-10-CM | POA: Insufficient documentation

## 2013-04-03 MED ORDER — IOHEXOL 300 MG/ML  SOLN
10.0000 mL | Freq: Once | INTRAMUSCULAR | Status: AC | PRN
  Administered 2013-04-03: 10 mL

## 2013-04-03 MED ORDER — OXYCODONE HCL 5 MG/5ML PO SOLN: ORAL | Status: DC

## 2013-04-03 NOTE — ED Notes (Signed)
Bed: QJ19 Expected date:  Expected time:  Means of arrival:  Comments: Catheter pulled out

## 2013-04-03 NOTE — Discharge Instructions (Signed)
You should be contacted later today by radiology to have your PEG tube replaced through our radiology department.  Have your facility contact [facility] radiology if you have not been contacted by mid day.

## 2013-04-03 NOTE — ED Notes (Signed)
PTAR present to take patient back to [facility]

## 2013-04-03 NOTE — ED Notes (Signed)
[facility] SN[facility] called r/t size of g-tube Per staff, tube is 82 [place] Will make Dr. Sharol Given aware

## 2013-04-03 NOTE — ED Notes (Signed)
Patient to be DC back to [facility] SN[facility] and return to IR for g-tube placement Radiology Order sent back along with paperwork to [facility]

## 2013-04-03 NOTE — ED Provider Notes (Signed)
CSN: [id number]     Arrival date & time 04/03/13  0121 History   First MD Initiated Contact with Patient 04/03/13 [phone]     Chief Complaint  Patient presents with  . gastric tube problem    (Consider location/radiation/quality/duration/timing/severity/associated sxs/prior Treatment) HPI 80 year old male presents to emergency room from his nursing facility after accidentally playing out.  His PEG tube.  Patient reports he rolled over in the bed and tried to get up and inadvertently pulled it out.  Patient had PEG tube placed at [facility] one month ago during upper endoscopy.  Patient with recent complicated course hospitalization at [facility], which included a paraesophageal hernia repair, and G-tube placement by GI during endoscopy.  Patient reports he is able to eat and drink orally, but does not have an appetite, and receives most of his nutrition through his PEG tube. Past Medical History  Diagnosis Date  . Coronary artery disease   . S/P CABG (coronary artery bypass graft)     x 2  . Hypertension   . Hyperlipidemia   . Abdominal aortic aneurysm     7.3 cm x 7.4 cm  . Stroke   . Cancer     prostate  . Stomach problems   . Arthritis     Gout  . GERD (gastroesophageal reflux disease)   . Mild aortic stenosis   . CHF (congestive heart failure)   . Chronic kidney disease     Stage II   Past Surgical History  Procedure Laterality Date  . Hernia repair      4  . Replacement total knee    . Prostatectomy    . Coronary artery bypass graft    . Bypass graft     Family History  Problem Relation Age of Onset  . Cancer Mother   . Cirrhosis Neg Hx     family member passed away at 38   History  Substance Use Topics  . Smoking status: Former Smoker -- 0.75 packs/day for 37 years    Types: Cigars    Quit date: 03/06/1988  . Smokeless tobacco: Former Systems developer    Types: Chew  . Alcohol Use: 0.0 oz/week     Comment: 14-20 DRINKS WEEKLY OF GIN / SCOTCH    Review of Systems  See History of  Present Illness; otherwise all other systems are reviewed and negative Allergies  Amoxicillin; Metrizamide; Penicillins; and Contrast media  Home Medications   Current Outpatient Rx  Name  Route  Sig  Dispense  Refill  . allopurinol (ZYLOPRIM) 100 MG tablet   Oral   Take 100 mg by mouth 2 (two) times daily.          Marland Kitchen aspirin EC 81 MG tablet   Oral   Take 81 mg by mouth daily.         Marland Kitchen atorvastatin (LIPITOR) 20 MG tablet   Oral   Take 20 mg by mouth at bedtime.          . clindamycin (CLEOCIN) 300 MG capsule   Oral   Take 300 mg by mouth 3 (three) times daily.         . clopidogrel (PLAVIX) 75 MG tablet   Oral   Take 75 mg by mouth daily with breakfast.         . docusate (COLACE) 50 MG/5ML liquid   Oral   Take 100 mg by mouth 2 (two) times daily.         Marland Kitchen FLUoxetine (PROZAC)  20 MG/5ML solution   Oral   Take 40 mg by mouth daily.         Marland Kitchen lisinopril (PRINIVIL,ZESTRIL) 20 MG tablet   Oral   Take 20 mg by mouth 2 (two) times daily.         Marland Kitchen MELATONIN PO   Oral   Take 6 mg by mouth at bedtime.          . metoprolol tartrate (LOPRESSOR) 12.5 mg TABS tablet   Oral   Take 12.5 mg by mouth 2 (two) times daily.         . multivitamin-lutein (OCUVITE-LUTEIN) CAPS capsule   Oral   Take 1 capsule by mouth daily.         Marland Kitchen oxyCODONE (ROXICODONE INTENSOL) 20 MG/ML concentrated solution   Oral   Take 5 mg by mouth every 6 (six) hours as needed for severe pain.         Marland Kitchen oxyCODONE-acetaminophen (PERCOCET/ROXICET) 5-325 MG per tablet   Oral   Take 2 tablets by mouth every 4 (four) hours as needed for severe pain.         . pantoprazole (PROTONIX) 40 MG tablet   Oral   Take 40 mg by mouth daily.          BP 126/62  Pulse 74  Temp(Src) 98.5 F (36.9 C) (Oral)  Resp 12  Ht 6' (1.829 m)  Wt 178 lb (80.74 kg)  BMI 24.14 kg/m2  SpO2 99% Physical Exam  Nursing note and vitals reviewed. Constitutional: He appears well-developed and  well-nourished. No distress.  Abdominal:  G-tube site with signs of recent removal.  There is bleeding around the stoma.  There is some irregularity of the skin around the area, consistent with removal of the G-tube with balloon inflated.  No active bleeding at this time.    ED Course  Procedures (including critical care time) Labs Review Labs Reviewed - No data to display Imaging Review No results found.  EKG Interpretation   None       MDM   1. Visit for feeding tube placement    80 year old male with accidental removal of his g tube.  This was a fairly recent PEG tube placement, done 29 days ago.  I was unable to find a definitive tract in order to to replace at the bedside.  I am concerned that a more forceful attempts will lead to a false lumen.  Discussed with radiology, patient will need interventional radiology placement of his PEG tube.  An order has been placed with [facility] department to have this done.    Kalman Drape, MD 04/03/13 [phone]

## 2013-04-03 NOTE — ED Notes (Signed)
[facility] called and made aware that patient will be returning shortly

## 2013-04-03 NOTE — Procedures (Signed)
Replacement 38f Gastrostomy No complication No blood loss. See complete dictation in [facility].

## 2013-04-03 NOTE — ED Notes (Addendum)
Patient arrives via EMS from [facility] due to c/o gastric tube catheter that was completely removed/pulled out when patient got out of bed  Patient states that he "forgot that catheter was connected" prior to getting up  Small amount of blood noted on gauze dressing to left abdomen--bleeding controlled Patient here to have g-tube replaced

## 2013-04-04 ENCOUNTER — Other Ambulatory Visit: Payer: Self-pay | Admitting: Internal Medicine

## 2013-04-04 ENCOUNTER — Ambulatory Visit (HOSPITAL_COMMUNITY)
Admission: RE | Admit: 2013-04-04 | Discharge: 2013-04-04 | Disposition: A | Discharge status: Home / Self Care | Payer: Medicare Other | Source: Ambulatory Visit | Attending: Internal Medicine | Admitting: Internal Medicine

## 2013-04-04 DIAGNOSIS — R633 Feeding difficulties, unspecified: Secondary | ICD-10-CM

## 2013-04-04 DIAGNOSIS — R1319 Other dysphagia: Secondary | ICD-10-CM | POA: Insufficient documentation

## 2013-04-04 DIAGNOSIS — Z431 Encounter for attention to gastrostomy: Secondary | ICD-10-CM | POA: Insufficient documentation

## 2013-04-04 MED ORDER — IOHEXOL 300 MG/ML  SOLN: 10.0000 mL | Freq: Once | INTRAMUSCULAR | Status: AC | PRN

## 2013-04-04 NOTE — Procedures (Signed)
Successful fluoroscopic guided replacement and up-sizing of a new 24 Fr gastrostomy tube.  No immediate post procedural complications.  The feeding tube is ready for immediate use.

## 2013-04-04 NOTE — Progress Notes (Signed)
Patient returned to day  After G tube placed yesterday had been pulled out again.  His daughter, Patrici Ranks, spoke to me on the phone concernd that she had not been called yesterday.  She also wanted to make sure we had the contact information for the patients GI physician in [place].  I told her I would document it in the notes: Dr Cammie Sickle GI clinic [phone], Office [phone].  Dr watts spoke to her via phone regarding the plan to replace the G tube.

## 2013-04-15 ENCOUNTER — Non-Acute Institutional Stay (SKILLED_NURSING_FACILITY): Payer: Medicare Other | Admitting: Internal Medicine

## 2013-04-15 ENCOUNTER — Encounter: Payer: Self-pay | Admitting: Internal Medicine

## 2013-04-15 DIAGNOSIS — I714 Abdominal aortic aneurysm, without rupture, unspecified: Secondary | ICD-10-CM

## 2013-04-15 DIAGNOSIS — E785 Hyperlipidemia, unspecified: Secondary | ICD-10-CM

## 2013-04-15 DIAGNOSIS — Z954 Presence of other heart-valve replacement: Secondary | ICD-10-CM

## 2013-04-15 DIAGNOSIS — K922 Gastrointestinal hemorrhage, unspecified: Secondary | ICD-10-CM

## 2013-04-15 DIAGNOSIS — Z952 Presence of prosthetic heart valve: Secondary | ICD-10-CM

## 2013-04-15 DIAGNOSIS — F329 Major depressive disorder, single episode, unspecified: Secondary | ICD-10-CM

## 2013-04-15 DIAGNOSIS — Z951 Presence of aortocoronary bypass graft: Secondary | ICD-10-CM

## 2013-04-15 DIAGNOSIS — I10 Essential (primary) hypertension: Secondary | ICD-10-CM

## 2013-04-15 NOTE — Progress Notes (Signed)
MRN: [id number] Name: Takuya Lariccia.  Sex: male Age: 80 y.o. DOB: Jan 31, 1932  [place] #: Karren Burly Facility/Room: 120A Level Of Care: SNF Provider: Inocencio Homes D Emergency Contacts: Extended Emergency Contact Information Primary Emergency Contact: [place] Phone: [phone] Mobile Phone: [phone] Relation: Daughter Secondary Emergency Contact: T[place] Phone: [phone] Mobile Phone: [phone] Relation: Son  Code Status: FULL  Allergies: Amoxicillin; Metrizamide; Penicillins; and Contrast media  Chief Complaint  Patient presents with  . Discharge Note    HPI: Patient is 80 y.o. male who was admitted after a lower GI bleed who is now stable and ready to go home.  Past Medical History  Diagnosis Date  . Coronary artery disease   . S/P CABG (coronary artery bypass graft)     x 2  . Hypertension   . Hyperlipidemia   . Abdominal aortic aneurysm     7.3 cm x 7.4 cm  . Stroke   . Cancer     prostate  . Stomach problems   . Arthritis     Gout  . GERD (gastroesophageal reflux disease)   . Mild aortic stenosis   . CHF (congestive heart failure)   . Chronic kidney disease     Stage II    Past Surgical History  Procedure Laterality Date  . Hernia repair      4  . Replacement total knee    . Prostatectomy    . Coronary artery bypass graft    . Bypass graft        Medication List       This list is accurate as of: 04/15/13 12:25 PM.  Always use your most recent med list.               allopurinol 100 MG tablet  Commonly known as:  ZYLOPRIM  Take 100 mg by mouth 2 (two) times daily.     aspirin EC 81 MG tablet  Take 81 mg by mouth daily.     atorvastatin 20 MG tablet  Commonly known as:  LIPITOR  Take 20 mg by mouth at bedtime.     clopidogrel 75 MG tablet  Commonly known as:  PLAVIX  Take 75 mg by mouth daily with breakfast.     docusate sodium 100 MG  capsule  Commonly known as:  COLACE  Take 100 mg by mouth 2 (two) times daily.     FLUoxetine 20 MG capsule  Commonly known as:  PROZAC  Take 20 mg by mouth daily.     lisinopril 20 MG tablet  Commonly known as:  PRINIVIL,ZESTRIL  Take 20 mg by mouth 2 (two) times daily.     MELATONIN PO  Take 6 mg by mouth at bedtime.     metoprolol tartrate 12.5 mg Tabs tablet  Commonly known as:  LOPRESSOR  Take 12.5 mg by mouth 2 (two) times daily.     multivitamin-lutein Caps capsule  Take 1 capsule by mouth daily.     oxyCODONE 20 MG/ML concentrated solution  Commonly known as:  ROXICODONE INTENSOL  Take 5 mg by mouth every 6 (six) hours as needed for severe pain.     oxyCODONE 5 MG/5ML solution  Commonly known as:  ROXICODONE  Take 33ml by mouth every 6 hours as needed for pain     oxyCODONE-acetaminophen 5-325 MG per tablet  Commonly known as:  PERCOCET/ROXICET  Take 2 tablets by mouth every 4 (four) hours  as needed for severe pain.     pantoprazole 40 MG tablet  Commonly known as:  PROTONIX  Take 40 mg by mouth daily.        Meds ordered this encounter  Medications  . docusate sodium (COLACE) 100 MG capsule    Sig: Take 100 mg by mouth 2 (two) times daily.  Marland Kitchen FLUoxetine (PROZAC) 20 MG capsule    Sig: Take 20 mg by mouth daily.     There is no immunization history on file for this patient.  History  Substance Use Topics  . Smoking status: Former Smoker -- 0.75 packs/day for 37 years    Types: Cigars    Quit date: 03/06/1988  . Smokeless tobacco: Former Systems developer    Types: Chew  . Alcohol Use: 0.0 oz/week     Comment: 14-20 DRINKS WEEKLY OF GIN / SCOTCH    Filed Vitals:   04/15/13 1204  BP: 128/76  Pulse: 84  Temp: 97.7 F (36.5 C)  Resp: 20    Physical Exam  GENERAL APPEARANCE: Alert, conversant. Appropriately groomed. No acute distress.  HEENT: Unremarkable. RESPIRATORY: Breathing is even, unlabored. Lung sounds are clear   CARDIOVASCULAR: Heart RRR no  murmurs, rubs or gallops. No peripheral edema.  GASTROINTESTINAL: Abdomen is soft, non-tender, not distended w/ normal bowel sounds. NO G TUBE NEUROLOGIC: Cranial nerves 2-12 grossly intact. Moves all extremities no tremor.  Patient Active Problem List   Diagnosis Date Noted  . Major depressive disorder, single episode 04/01/2013  . Lower GI bleed 04/01/2013  . PEG (percutaneous endoscopic gastrostomy) status 04/01/2013  . Hx of CABG 04/01/2013  . Diaphragmatic hernia 02/28/2013  . Thoracoabdominal aneurysm 10/24/2012  . Abdominal aneurysm without mention of rupture 10/14/2012  . S/P CABG (coronary artery bypass graft) 10/10/2012  . H/O aortic valve replacement 10/10/2012  . H/O thoracic aortic aneurysm repair 10/10/2012  . Abdominal aortic aneurysm 10/10/2012  . Essential hypertension 10/10/2012  . Hyperlipidemia 10/10/2012    CBC    Component Value Date/Time   WBC 8.3 02/24/2013 1211   RBC 3.19* 02/24/2013 1211   HGB 10.4* 02/24/2013 1211   HCT 30.0* 02/24/2013 1211   PLT 313 02/24/2013 1211   MCV 94.0 02/24/2013 1211   LYMPHSABS 0.7 02/24/2013 1211   MONOABS 0.5 02/24/2013 1211   EOSABS 0.1 02/24/2013 1211   BASOSABS 0.0 02/24/2013 1211    CMP     Component Value Date/Time   NA 138 02/24/2013 1211   K 3.1* 02/24/2013 1211   CL 101 02/24/2013 1211   CO2 24 02/24/2013 1211   GLUCOSE 120* 02/24/2013 1211   BUN 14 02/24/2013 1211   CREATININE 0.84 02/24/2013 1211   CREATININE 0.89 10/14/2012 1409   CALCIUM 8.5 02/24/2013 1211   PROT 6.5 02/24/2013 1211   ALBUMIN 2.9* 02/24/2013 1211   AST 53* 02/24/2013 1211   ALT 106* 02/24/2013 1211   ALKPHOS 574* 02/24/2013 1211   BILITOT 0.9 02/24/2013 1211   GFRNONAA 81* 02/24/2013 1211   GFRAA >90 02/24/2013 1211    Assessment and Plan  Pt is improved and stable for discharge to home. Pt had his G tube removed yesterday.  Hennie Duos, MD

## 2013-05-15 ENCOUNTER — Ambulatory Visit: Payer: Medicare Other | Admitting: Nurse Practitioner

## 2013-06-10 ENCOUNTER — Encounter (HOSPITAL_COMMUNITY): Payer: Self-pay | Admitting: Emergency Medicine

## 2013-06-10 ENCOUNTER — Inpatient Hospital Stay (HOSPITAL_COMMUNITY)
Admission: EM | Admit: 2013-06-10 | Discharge: 2013-06-17 | DRG: 470 | Disposition: A | Discharge status: Skilled Nursing Facility | Payer: Medicare Other | Attending: Internal Medicine | Admitting: Internal Medicine

## 2013-06-10 ENCOUNTER — Encounter: Payer: Self-pay | Admitting: Cardiovascular Disease

## 2013-06-10 ENCOUNTER — Emergency Department (HOSPITAL_COMMUNITY): Payer: Medicare Other

## 2013-06-10 DIAGNOSIS — R338 Other retention of urine: Secondary | ICD-10-CM

## 2013-06-10 DIAGNOSIS — K449 Diaphragmatic hernia without obstruction or gangrene: Secondary | ICD-10-CM

## 2013-06-10 DIAGNOSIS — D62 Acute posthemorrhagic anemia: Secondary | ICD-10-CM

## 2013-06-10 DIAGNOSIS — Z880 Allergy status to penicillin: Secondary | ICD-10-CM

## 2013-06-10 DIAGNOSIS — Z881 Allergy status to other antibiotic agents status: Secondary | ICD-10-CM

## 2013-06-10 DIAGNOSIS — M109 Gout, unspecified: Secondary | ICD-10-CM | POA: Diagnosis present

## 2013-06-10 DIAGNOSIS — Z954 Presence of other heart-valve replacement: Secondary | ICD-10-CM

## 2013-06-10 DIAGNOSIS — K922 Gastrointestinal hemorrhage, unspecified: Secondary | ICD-10-CM

## 2013-06-10 DIAGNOSIS — I509 Heart failure, unspecified: Secondary | ICD-10-CM | POA: Diagnosis present

## 2013-06-10 DIAGNOSIS — S72009A Fracture of unspecified part of neck of unspecified femur, initial encounter for closed fracture: Secondary | ICD-10-CM

## 2013-06-10 DIAGNOSIS — I359 Nonrheumatic aortic valve disorder, unspecified: Secondary | ICD-10-CM | POA: Diagnosis present

## 2013-06-10 DIAGNOSIS — F329 Major depressive disorder, single episode, unspecified: Secondary | ICD-10-CM

## 2013-06-10 DIAGNOSIS — Z952 Presence of prosthetic heart valve: Secondary | ICD-10-CM

## 2013-06-10 DIAGNOSIS — G3183 Dementia with Lewy bodies: Secondary | ICD-10-CM

## 2013-06-10 DIAGNOSIS — Z91041 Radiographic dye allergy status: Secondary | ICD-10-CM

## 2013-06-10 DIAGNOSIS — Z87891 Personal history of nicotine dependence: Secondary | ICD-10-CM

## 2013-06-10 DIAGNOSIS — K219 Gastro-esophageal reflux disease without esophagitis: Secondary | ICD-10-CM | POA: Diagnosis present

## 2013-06-10 DIAGNOSIS — Z888 Allergy status to other drugs, medicaments and biological substances status: Secondary | ICD-10-CM

## 2013-06-10 DIAGNOSIS — Z951 Presence of aortocoronary bypass graft: Secondary | ICD-10-CM

## 2013-06-10 DIAGNOSIS — I714 Abdominal aortic aneurysm, without rupture, unspecified: Secondary | ICD-10-CM

## 2013-06-10 DIAGNOSIS — F0280 Dementia in other diseases classified elsewhere without behavioral disturbance: Secondary | ICD-10-CM

## 2013-06-10 DIAGNOSIS — I10 Essential (primary) hypertension: Secondary | ICD-10-CM

## 2013-06-10 DIAGNOSIS — I716 Thoracoabdominal aortic aneurysm, without rupture, unspecified: Secondary | ICD-10-CM

## 2013-06-10 DIAGNOSIS — IMO0002 Reserved for concepts with insufficient information to code with codable children: Secondary | ICD-10-CM

## 2013-06-10 DIAGNOSIS — D61818 Other pancytopenia: Secondary | ICD-10-CM

## 2013-06-10 DIAGNOSIS — N182 Chronic kidney disease, stage 2 (mild): Secondary | ICD-10-CM | POA: Diagnosis present

## 2013-06-10 DIAGNOSIS — K5900 Constipation, unspecified: Secondary | ICD-10-CM | POA: Diagnosis present

## 2013-06-10 DIAGNOSIS — R339 Retention of urine, unspecified: Secondary | ICD-10-CM | POA: Diagnosis not present

## 2013-06-10 DIAGNOSIS — Z8679 Personal history of other diseases of the circulatory system: Secondary | ICD-10-CM

## 2013-06-10 DIAGNOSIS — F0390 Unspecified dementia without behavioral disturbance: Secondary | ICD-10-CM | POA: Diagnosis present

## 2013-06-10 DIAGNOSIS — I2510 Atherosclerotic heart disease of native coronary artery without angina pectoris: Secondary | ICD-10-CM | POA: Diagnosis present

## 2013-06-10 DIAGNOSIS — S72033A Displaced midcervical fracture of unspecified femur, initial encounter for closed fracture: Principal | ICD-10-CM | POA: Diagnosis present

## 2013-06-10 DIAGNOSIS — W19XXXA Unspecified fall, initial encounter: Secondary | ICD-10-CM | POA: Diagnosis present

## 2013-06-10 DIAGNOSIS — Z7902 Long term (current) use of antithrombotics/antiplatelets: Secondary | ICD-10-CM

## 2013-06-10 DIAGNOSIS — Z96659 Presence of unspecified artificial knee joint: Secondary | ICD-10-CM

## 2013-06-10 DIAGNOSIS — E872 Acidosis, unspecified: Secondary | ICD-10-CM | POA: Diagnosis not present

## 2013-06-10 DIAGNOSIS — Z9079 Acquired absence of other genital organ(s): Secondary | ICD-10-CM

## 2013-06-10 DIAGNOSIS — E860 Dehydration: Secondary | ICD-10-CM | POA: Diagnosis present

## 2013-06-10 DIAGNOSIS — Z79899 Other long term (current) drug therapy: Secondary | ICD-10-CM

## 2013-06-10 DIAGNOSIS — R5082 Postprocedural fever: Secondary | ICD-10-CM | POA: Diagnosis not present

## 2013-06-10 DIAGNOSIS — Y92009 Unspecified place in unspecified non-institutional (private) residence as the place of occurrence of the external cause: Secondary | ICD-10-CM

## 2013-06-10 DIAGNOSIS — Z8546 Personal history of malignant neoplasm of prostate: Secondary | ICD-10-CM

## 2013-06-10 DIAGNOSIS — Z7982 Long term (current) use of aspirin: Secondary | ICD-10-CM

## 2013-06-10 DIAGNOSIS — I129 Hypertensive chronic kidney disease with stage 1 through stage 4 chronic kidney disease, or unspecified chronic kidney disease: Secondary | ICD-10-CM | POA: Diagnosis present

## 2013-06-10 DIAGNOSIS — Z9889 Other specified postprocedural states: Secondary | ICD-10-CM

## 2013-06-10 DIAGNOSIS — E785 Hyperlipidemia, unspecified: Secondary | ICD-10-CM

## 2013-06-10 DIAGNOSIS — Z8673 Personal history of transient ischemic attack (TIA), and cerebral infarction without residual deficits: Secondary | ICD-10-CM

## 2013-06-10 DIAGNOSIS — Z931 Gastrostomy status: Secondary | ICD-10-CM

## 2013-06-10 LAB — CBC WITH DIFFERENTIAL/PLATELET
BASOS PCT: 1 % (ref 0–1)
Basophils Absolute: 0 10*3/uL (ref 0.0–0.1)
EOS PCT: 5 % (ref 0–5)
Eosinophils Absolute: 0.1 10*3/uL (ref 0.0–0.7)
HCT: 32.9 % — ABNORMAL LOW (ref 39.0–52.0)
HEMOGLOBIN: 11.1 g/dL — AB (ref 13.0–17.0)
LYMPHS ABS: 1.1 10*3/uL (ref 0.7–4.0)
Lymphocytes Relative: 41 % (ref 12–46)
MCH: 27.3 pg (ref 26.0–34.0)
MCHC: 33.7 g/dL (ref 30.0–36.0)
MCV: 80.8 fL (ref 78.0–100.0)
MONO ABS: 0.5 10*3/uL (ref 0.1–1.0)
Monocytes Relative: 20 % — ABNORMAL HIGH (ref 3–12)
Neutro Abs: 0.8 10*3/uL — ABNORMAL LOW (ref 1.7–7.7)
Neutrophils Relative %: 33 % — ABNORMAL LOW (ref 43–77)
Platelets: 167 10*3/uL (ref 150–400)
RBC: 4.07 MIL/uL — AB (ref 4.22–5.81)
RDW: 15.8 % — ABNORMAL HIGH (ref 11.5–15.5)
WBC: 2.5 10*3/uL — ABNORMAL LOW (ref 4.0–10.5)

## 2013-06-10 LAB — CBC
HCT: 30.8 % — ABNORMAL LOW (ref 39.0–52.0)
HEMOGLOBIN: 10.3 g/dL — AB (ref 13.0–17.0)
MCH: 27 pg (ref 26.0–34.0)
MCHC: 33.4 g/dL (ref 30.0–36.0)
MCV: 80.6 fL (ref 78.0–100.0)
PLATELETS: 165 10*3/uL (ref 150–400)
RBC: 3.82 MIL/uL — ABNORMAL LOW (ref 4.22–5.81)
RDW: 15.8 % — ABNORMAL HIGH (ref 11.5–15.5)
WBC: 2.3 10*3/uL — ABNORMAL LOW (ref 4.0–10.5)

## 2013-06-10 LAB — BASIC METABOLIC PANEL
BUN: 23 mg/dL (ref 6–23)
CALCIUM: 8.9 mg/dL (ref 8.4–10.5)
CO2: 17 meq/L — AB (ref 19–32)
CREATININE: 0.88 mg/dL (ref 0.50–1.35)
Chloride: 104 mEq/L (ref 96–112)
GFR calc Af Amer: >90 mL/min (ref >90)
GFR calc non Af Amer: 80 mL/min — ABNORMAL LOW (ref >90)
GLUCOSE: 89 mg/dL (ref 70–99)
Potassium: 4 mEq/L (ref 3.7–5.3)
Sodium: 139 mEq/L (ref 137–147)

## 2013-06-10 LAB — CREATININE, SERUM
Creatinine, Ser: 0.76 mg/dL (ref 0.50–1.35)
GFR calc Af Amer: >90 mL/min (ref >90)
GFR calc non Af Amer: 85 mL/min — ABNORMAL LOW (ref >90)

## 2013-06-10 LAB — TROPONIN I: Troponin I: <0.3 ng/mL (ref <0.30)

## 2013-06-10 MED ORDER — MORPHINE SULFATE 4 MG/ML IJ SOLN
4.0000 mg | Freq: Once | INTRAMUSCULAR | Status: DC
  Filled 2013-06-10: qty 1

## 2013-06-10 MED ORDER — ACETAMINOPHEN 650 MG RE SUPP: 650.0000 mg | Freq: Four times a day (QID) | RECTAL | Status: DC | PRN

## 2013-06-10 MED ORDER — HEPARIN SODIUM (PORCINE) 5000 UNIT/ML IJ SOLN
5000.0000 [IU] | Freq: Three times a day (TID) | INTRAMUSCULAR | Status: DC
  Administered 2013-06-10 – 2013-06-11 (×2): 5000 [IU] via SUBCUTANEOUS
  Filled 2013-06-10 (×8): qty 1

## 2013-06-10 MED ORDER — OXYCODONE HCL 5 MG PO TABS
5.0000 mg | ORAL_TABLET | Freq: Two times a day (BID) | ORAL | Status: DC | PRN
  Administered 2013-06-11 – 2013-06-12 (×4): 5 mg via ORAL
  Administered 2013-06-13 – 2013-06-15 (×6): 10 mg via ORAL
  Filled 2013-06-10: qty 1
  Filled 2013-06-10: qty 2
  Filled 2013-06-10: qty 1
  Filled 2013-06-10 (×5): qty 2
  Filled 2013-06-10: qty 1
  Filled 2013-06-10: qty 2
  Filled 2013-06-10: qty 1
  Filled 2013-06-10 (×3): qty 2

## 2013-06-10 MED ORDER — ATORVASTATIN CALCIUM 20 MG PO TABS
20.0000 mg | ORAL_TABLET | Freq: Every day | ORAL | Status: DC
  Administered 2013-06-11 – 2013-06-17 (×6): 20 mg via ORAL
  Filled 2013-06-10 (×7): qty 1

## 2013-06-10 MED ORDER — AMLODIPINE BESYLATE 5 MG PO TABS
5.0000 mg | ORAL_TABLET | Freq: Every day | ORAL | Status: DC
  Administered 2013-06-11 – 2013-06-17 (×7): 5 mg via ORAL
  Filled 2013-06-10 (×7): qty 1

## 2013-06-10 MED ORDER — SODIUM CHLORIDE 0.9 % IV SOLN
INTRAVENOUS | Status: DC
  Administered 2013-06-10 – 2013-06-14 (×3): via INTRAVENOUS
  Administered 2013-06-15: 1000 mL via INTRAVENOUS

## 2013-06-10 MED ORDER — OCUVITE-LUTEIN PO CAPS
1.0000 | ORAL_CAPSULE | Freq: Every day | ORAL | Status: DC
  Administered 2013-06-10 – 2013-06-16 (×7): 1 via ORAL
  Filled 2013-06-10 (×11): qty 1

## 2013-06-10 MED ORDER — SODIUM CHLORIDE 0.9 % IJ SOLN
3.0000 mL | Freq: Two times a day (BID) | INTRAMUSCULAR | Status: DC
  Administered 2013-06-10 – 2013-06-17 (×9): 3 mL via INTRAVENOUS

## 2013-06-10 MED ORDER — MORPHINE SULFATE 2 MG/ML IJ SOLN: 0.5000 mg | INTRAMUSCULAR | Status: DC | PRN

## 2013-06-10 MED ORDER — ACETAMINOPHEN 325 MG PO TABS
650.0000 mg | ORAL_TABLET | Freq: Once | ORAL | Status: DC
  Filled 2013-06-10: qty 2

## 2013-06-10 MED ORDER — HYDROCODONE-ACETAMINOPHEN 5-325 MG PO TABS: 1.0000 | ORAL_TABLET | ORAL | Status: AC | PRN

## 2013-06-10 MED ORDER — SODIUM CHLORIDE 0.9 % IV BOLUS (SEPSIS)
500.0000 mL | Freq: Once | INTRAVENOUS | Status: AC
  Administered 2013-06-10: 500 mL via INTRAVENOUS

## 2013-06-10 MED ORDER — ONDANSETRON HCL 4 MG/2ML IJ SOLN
4.0000 mg | Freq: Four times a day (QID) | INTRAMUSCULAR | Status: DC | PRN
  Administered 2013-06-11: 4 mg via INTRAVENOUS
  Filled 2013-06-10: qty 2

## 2013-06-10 MED ORDER — METOPROLOL TARTRATE 12.5 MG HALF TABLET
12.5000 mg | ORAL_TABLET | Freq: Two times a day (BID) | ORAL | Status: DC
  Administered 2013-06-10 – 2013-06-17 (×13): 12.5 mg via ORAL
  Filled 2013-06-10 (×15): qty 1

## 2013-06-10 MED ORDER — ONDANSETRON HCL 4 MG/2ML IJ SOLN: 4.0000 mg | Freq: Three times a day (TID) | INTRAMUSCULAR | Status: DC | PRN

## 2013-06-10 MED ORDER — FLUOXETINE HCL 20 MG PO CAPS
20.0000 mg | ORAL_CAPSULE | ORAL | Status: DC
  Administered 2013-06-10 – 2013-06-14 (×7): 20 mg via ORAL
  Filled 2013-06-10 (×10): qty 1

## 2013-06-10 MED ORDER — ALLOPURINOL 100 MG PO TABS
100.0000 mg | ORAL_TABLET | ORAL | Status: DC
  Administered 2013-06-11 – 2013-06-17 (×12): 100 mg via ORAL
  Filled 2013-06-10 (×16): qty 1

## 2013-06-10 MED ORDER — ASPIRIN EC 81 MG PO TBEC
81.0000 mg | DELAYED_RELEASE_TABLET | Freq: Every day | ORAL | Status: DC
  Administered 2013-06-10 – 2013-06-16 (×6): 81 mg via ORAL
  Filled 2013-06-10 (×9): qty 1

## 2013-06-10 MED ORDER — MORPHINE SULFATE 2 MG/ML IJ SOLN
2.0000 mg | INTRAMUSCULAR | Status: DC | PRN
  Administered 2013-06-13 (×4): 2 mg via INTRAVENOUS
  Filled 2013-06-10 (×4): qty 1

## 2013-06-10 MED ORDER — LISINOPRIL 20 MG PO TABS
20.0000 mg | ORAL_TABLET | Freq: Every day | ORAL | Status: DC
  Administered 2013-06-11 – 2013-06-17 (×7): 20 mg via ORAL
  Filled 2013-06-10 (×7): qty 1

## 2013-06-10 MED ORDER — MEGESTROL ACETATE 40 MG/ML PO SUSP
200.0000 mg | Freq: Every day | ORAL | Status: DC
  Administered 2013-06-11: 200 mg via ORAL
  Filled 2013-06-10 (×2): qty 5

## 2013-06-10 MED ORDER — ZOLPIDEM TARTRATE 5 MG PO TABS
5.0000 mg | ORAL_TABLET | Freq: Once | ORAL | Status: AC
  Administered 2013-06-10: 5 mg via ORAL
  Filled 2013-06-10: qty 1

## 2013-06-10 MED ORDER — SODIUM CHLORIDE 0.9 % IV SOLN: INTRAVENOUS | Status: DC

## 2013-06-10 MED ORDER — ONDANSETRON HCL 4 MG PO TABS: 4.0000 mg | ORAL_TABLET | Freq: Four times a day (QID) | ORAL | Status: DC | PRN

## 2013-06-10 MED ORDER — ACETAMINOPHEN 325 MG PO TABS
650.0000 mg | ORAL_TABLET | Freq: Four times a day (QID) | ORAL | Status: DC | PRN
Start: 2013-06-10 — End: 2013-06-17
  Administered 2013-06-13 – 2013-06-17 (×5): 650 mg via ORAL
  Filled 2013-06-10 (×5): qty 2

## 2013-06-10 NOTE — ED Provider Notes (Signed)
CSN: [id number]     Arrival date & time 06/10/13  1334 History   First MD Initiated Contact with Patient 06/10/13 1335     Chief Complaint  Patient presents with  . Fall     (Consider location/radiation/quality/duration/timing/severity/associated sxs/prior Treatment) Patient is a 80 y.o. male presenting with fall. The history is provided by the patient.  Fall   patient here after having a witnessed fall just prior to arrival. History of multiple falls in the past week. Says that he becomes off balance when he tries and delayed. Denies any headaches. No loss of consciousness. Complains of pain to his right hip characterized as sharp and worse with standing. Also complains of paraspinal cervical pain without weakness in his arms or legs. Denies any recent illnesses such as chest pain or shortness of breath. No abdominal pain. No black or blood stools. Patient takes Plavix due to the history of peripheral vascular disease.  Past Medical History  Diagnosis Date  . Coronary artery disease   . S/P CABG (coronary artery bypass graft)     x 2  . Hypertension   . Hyperlipidemia   . Abdominal aortic aneurysm     7.3 cm x 7.4 cm  . Stroke   . Cancer     prostate  . Stomach problems   . Arthritis     Gout  . GERD (gastroesophageal reflux disease)   . Mild aortic stenosis   . CHF (congestive heart failure)   . Chronic kidney disease     Stage II   Past Surgical History  Procedure Laterality Date  . Hernia repair      4  . Replacement total knee    . Prostatectomy    . Coronary artery bypass graft    . Bypass graft     Family History  Problem Relation Age of Onset  . Cancer Mother   . Cirrhosis Neg Hx     family member passed away at 40   History  Substance Use Topics  . Smoking status: Former Smoker -- 0.75 packs/day for 37 years    Types: Cigars    Quit date: 03/06/1988  . Smokeless tobacco: Former Systems developer    Types: Chew  . Alcohol Use: 0.0 oz/week     Comment: 14-20 DRINKS  WEEKLY OF GIN / SCOTCH    Review of Systems  All other systems reviewed and are negative.      Allergies  Amoxicillin; Metrizamide; Penicillins; and Contrast media  Home Medications   Current Outpatient Rx  Name  Route  Sig  Dispense  Refill  . allopurinol (ZYLOPRIM) 100 MG tablet   Oral   Take 100 mg by mouth 2 (two) times daily.          Marland Kitchen aspirin EC 81 MG tablet   Oral   Take 81 mg by mouth daily.         Marland Kitchen atorvastatin (LIPITOR) 20 MG tablet   Oral   Take 20 mg by mouth at bedtime.          . clopidogrel (PLAVIX) 75 MG tablet   Oral   Take 75 mg by mouth daily with breakfast.         . docusate sodium (COLACE) 100 MG capsule   Oral   Take 100 mg by mouth 2 (two) times daily.         Marland Kitchen FLUoxetine (PROZAC) 20 MG capsule   Oral   Take 20 mg by mouth daily.         Marland Kitchen  lisinopril (PRINIVIL,ZESTRIL) 20 MG tablet   Oral   Take 20 mg by mouth 2 (two) times daily.         Marland Kitchen MELATONIN PO   Oral   Take 6 mg by mouth at bedtime.          . metoprolol tartrate (LOPRESSOR) 12.5 mg TABS tablet   Oral   Take 12.5 mg by mouth 2 (two) times daily.         . multivitamin-lutein (OCUVITE-LUTEIN) CAPS capsule   Oral   Take 1 capsule by mouth daily.         Marland Kitchen oxyCODONE (ROXICODONE INTENSOL) 20 MG/ML concentrated solution   Oral   Take 5 mg by mouth every 6 (six) hours as needed for severe pain.         Marland Kitchen oxyCODONE (ROXICODONE) 5 MG/5ML solution      Take 21ml by mouth every 6 hours as needed for pain   15 mL   0   . oxyCODONE-acetaminophen (PERCOCET/ROXICET) 5-325 MG per tablet   Oral   Take 2 tablets by mouth every 4 (four) hours as needed for severe pain.         . pantoprazole (PROTONIX) 40 MG tablet   Oral   Take 40 mg by mouth daily.          There were no vitals taken for this visit. Physical Exam  Nursing note and vitals reviewed. Constitutional: He is oriented to person, place, and time. He appears well-developed and  well-nourished.  Non-toxic appearance. No distress.  HENT:  Head: Normocephalic and atraumatic.  Eyes: Conjunctivae, EOM and lids are normal. Pupils are equal, round, and reactive to light.  Neck: Normal range of motion. Neck supple. Spinous process tenderness and muscular tenderness present. No tracheal deviation present. No mass present.    Cardiovascular: Normal rate, regular rhythm and normal heart sounds.  Exam reveals no gallop.   No murmur heard. Pulmonary/Chest: Effort normal and breath sounds normal. No stridor. No respiratory distress. He has no decreased breath sounds. He has no wheezes. He has no rhonchi. He has no rales.  Abdominal: Soft. Normal appearance and bowel sounds are normal. He exhibits no distension. There is no tenderness. There is no rebound and no CVA tenderness.  Musculoskeletal: Normal range of motion. He exhibits no edema and no tenderness.       Legs:  Right hip without shortening or rotation. Neurovascular intact. Pain to palpation at right mid buttocks. No pain to palpation of the right ankle or knee. Nontender along the right tibia and fib. Nontender along the right femur  Neurological: He is alert and oriented to person, place, and time. He has normal strength. No cranial nerve deficit or sensory deficit. GCS eye subscore is 4. GCS verbal subscore is 5. GCS motor subscore is 6.  Skin: Skin is warm and dry. No abrasion and no rash noted.  Psychiatric: He has a normal mood and affect. His speech is normal and behavior is normal.    ED Course  Procedures (including critical care time) Labs Review Labs Reviewed  CBC WITH DIFFERENTIAL - Abnormal; Notable for the following:    WBC 2.5 (*)    RBC 4.07 (*)    Hemoglobin 11.1 (*)    HCT 32.9 (*)    RDW 15.8 (*)    Neutrophils Relative % 33 (*)    Monocytes Relative 20 (*)    Neutro Abs 0.8 (*)    All other components within  normal limits  BASIC METABOLIC PANEL - Abnormal; Notable for the following:    CO2  17 (*)    GFR calc non Af Amer 80 (*)    All other components within normal limits  TROPONIN I   Imaging Review Dg Chest 1 View  06/10/2013   CLINICAL DATA:  Fall.  Right hip fracture  EXAM: CHEST - 1 VIEW  COMPARISON:  02/24/2013  FINDINGS: Prior cardiac surgery. Aortic stent graft. Mild cardiac enlargement without heart failure. Lungs are clear  IMPRESSION: No active disease.   Electronically Signed   By: Franchot Gallo M.D.   On: 06/10/2013 15:24   Dg Hip Complete Right  06/10/2013   CLINICAL DATA:  Fall  EXAM: RIGHT HIP - COMPLETE 2+ VIEW  COMPARISON:  None.  FINDINGS: Subcapital femoral neck fracture with mild impaction and minimal displacement. Right hip joint appears satisfactory.  Aortobifemoral stent graft placement.  Arterial calcification.  IMPRESSION: Subcapital femoral neck fracture on the right.   Electronically Signed   By: Franchot Gallo M.D.   On: 06/10/2013 15:16   Ct Head Wo Contrast  06/10/2013   CLINICAL DATA:  Headache and neck pain  EXAM: CT HEAD WITHOUT CONTRAST  CT CERVICAL SPINE WITHOUT CONTRAST  TECHNIQUE: Multidetector CT imaging of the head and cervical spine was performed following the standard protocol without intravenous contrast. Multiplanar CT image reconstructions of the cervical spine were also generated.  COMPARISON:  None.  FINDINGS: CT HEAD FINDINGS  Advanced brain atrophy with associated ventricular enlargement. No acute intracranial hemorrhage, definite infarction, mass lesion, midline shift, herniation, or extra-axial fluid collection. No focal mass effect or edema. Normal gray-white matter differentiation. Cisterns patent. Cerebellar atrophy as well. Atherosclerosis of the intracranial vessels. Mastoids and sinuses are clear. No skull abnormality.  CT CERVICAL SPINE FINDINGS  Advanced cervical spondylosis and degenerative change at all levels. C5-6 solid bony fusion noted. No acute fracture, malalignment, subluxation, or dislocation. Diffuse facet arthropathy as  well. Facets remain aligned. Normal prevertebral soft tissues. Carotid calcifications noted. No soft tissue asymmetry in the neck. Clear lung apices.  IMPRESSION: Advanced brain atrophy.  No acute intracranial finding  Advanced cervical degenerative changes and spondylosis as described. No acute cervical spine fracture or abnormality by CT.   Electronically Signed   By: Daryll Brod M.D.   On: 06/10/2013 15:41   Ct Cervical Spine Wo Contrast  06/10/2013   CLINICAL DATA:  Headache and neck pain  EXAM: CT HEAD WITHOUT CONTRAST  CT CERVICAL SPINE WITHOUT CONTRAST  TECHNIQUE: Multidetector CT imaging of the head and cervical spine was performed following the standard protocol without intravenous contrast. Multiplanar CT image reconstructions of the cervical spine were also generated.  COMPARISON:  None.  FINDINGS: CT HEAD FINDINGS  Advanced brain atrophy with associated ventricular enlargement. No acute intracranial hemorrhage, definite infarction, mass lesion, midline shift, herniation, or extra-axial fluid collection. No focal mass effect or edema. Normal gray-white matter differentiation. Cisterns patent. Cerebellar atrophy as well. Atherosclerosis of the intracranial vessels. Mastoids and sinuses are clear. No skull abnormality.  CT CERVICAL SPINE FINDINGS  Advanced cervical spondylosis and degenerative change at all levels. C5-6 solid bony fusion noted. No acute fracture, malalignment, subluxation, or dislocation. Diffuse facet arthropathy as well. Facets remain aligned. Normal prevertebral soft tissues. Carotid calcifications noted. No soft tissue asymmetry in the neck. Clear lung apices.  IMPRESSION: Advanced brain atrophy.  No acute intracranial finding  Advanced cervical degenerative changes and spondylosis as described. No acute cervical spine  fracture or abnormality by CT.   Electronically Signed   By: Daryll Brod M.D.   On: 06/10/2013 15:41     EKG Interpretation   Date/Time:  Tuesday June 10 2013 13:55:33 EDT Ventricular Rate:  103 PR Interval:  179 QRS Duration: 82 QT Interval:  404 QTC Calculation: 529 R Axis:   75 Text Interpretation:  Age not entered, assumed to be  80 years old for  purpose of ECG interpretation Sinus or ectopic atrial tachycardia  Ventricular premature complex Borderline T wave abnormalities Prolonged QT  interval Baseline wander in lead(s) V3 Confirmed by Jaquel Glassburn  MD, Thamar Holik  ([id number]) on 06/10/2013 2:06:19 PM      MDM   Final diagnoses:  None   Patient with hip fracture as noted above. He is mildly dehydrated and given IV fluids here. I will consult the orthopedic surgeon on call as well as the hospitalist for admission.     Leota Jacobsen, MD 06/10/13 [phone]

## 2013-06-10 NOTE — ED Notes (Signed)
Patient transported to X-ray 

## 2013-06-10 NOTE — ED Notes (Signed)
Family at bedside. 

## 2013-06-10 NOTE — ED Notes (Signed)
Pt reports recent falls x 2 weeks. States "I feel like my equilibrium is off." Pt denies dizziness, lightheadedness at this time. Denies pain, but reports right side "buttocks" pain with movement or ambulation. Full ROM, but pain to right hip. No swelling, shortening or rotation. Pt also reports pain to back of neck; C Collar in place. Pt is AO x4. Neuro intact. VSS.

## 2013-06-10 NOTE — H&P (Signed)
[facility] History and Physical  Robert Mueller. WEX:[id number] DOB: 06-14-1931 DOA: 06/10/2013  Referring physician: Emergency Department PCP: No PCP Per Patient  Specialists:   Chief Complaint: Hip pain  HPI: Robert Mueller. is a 80 y.o. male  With a hx of prior cva, CAD s/p CABG, HTN, AAA, htn, chf who presents to the hospital s/p mechanical fall resulting in hip pain that occurred on 4/3. Pt noted pain in the R hip but did not go to hospital. Instead noted worsening pain and gait, resulting in ED visit today. In the Ed, CT head was unremarkable. Imaging of the hips however, demonstrated a R subcapital femoral neck fracture. Orthopedic surgery was consulted and the hospitalist service consulted for admission.  Of note, pt is s/p CABG x 2 and recent AAA repair at [facility]. Currently no CP, SOB, or palpitations.  Review of Systems:  Per above, the remainder of the 10pt ros reviewed and are neg  Past Medical History  Diagnosis Date  . Coronary artery disease   . S/P CABG (coronary artery bypass graft)     x 2  . Hypertension   . Hyperlipidemia   . Abdominal aortic aneurysm     7.3 cm x 7.4 cm  . Stroke   . Cancer     prostate  . Stomach problems   . Arthritis     Gout  . GERD (gastroesophageal reflux disease)   . Mild aortic stenosis   . CHF (congestive heart failure)   . Chronic kidney disease     Stage II   Past Surgical History  Procedure Laterality Date  . Hernia repair      4  . Replacement total knee    . Prostatectomy    . Coronary artery bypass graft    . Bypass graft     Social History:  reports that he quit smoking about 25 years ago. His smoking use included Cigars. He has quit using smokeless tobacco. His smokeless tobacco use included Chew. He reports that he drinks alcohol. He reports that he does not use illicit drugs.  where does patient live--home, ALF, SNF? and with whom if at home?  Can patient participate in ADLs?  Allergies  Allergen  Reactions  . Amoxicillin Other (See Comments)    Joint swelling  . Metrizamide Hives    Hives in 1955 during ivp, OK W/ 13 HR PREP ON 10-15-12//A.CALHOUN  . Penicillins Other (See Comments)    Joint swelling  . Contrast Media [Iodinated Diagnostic Agents] Hives    Hives in 1955 during ivp, OK W/ 13 HR PREP ON 10-15-12//A.CALHOUN    Family History  Problem Relation Age of Onset  . Cancer Mother   . Cirrhosis Neg Hx     family member passed away at 54    (be sure to complete)  Prior to Admission medications   Medication Sig Start Date End Date Taking? Authorizing Provider  allopurinol (ZYLOPRIM) 100 MG tablet Take 100 mg by mouth 2 (two) times daily. 8am and 1pm   Yes Historical Provider, MD  amLODipine (NORVASC) 5 MG tablet Take 5 mg by mouth daily.    Yes Historical Provider, MD  aspirin EC 81 MG tablet Take 81 mg by mouth at bedtime.    Yes Historical Provider, MD  atorvastatin (LIPITOR) 20 MG tablet Take 20 mg by mouth daily.    Yes Historical Provider, MD  clopidogrel (PLAVIX) 75 MG tablet Take 75 mg by mouth daily.  Yes Historical Provider, MD  FLUoxetine (PROZAC) 20 MG capsule Take 20 mg by mouth 2 (two) times daily. 8am and 1pm   Yes Historical Provider, MD  lisinopril (PRINIVIL,ZESTRIL) 20 MG tablet Take 20 mg by mouth daily.    Yes Historical Provider, MD  megestrol (MEGACE) 40 MG/ML suspension Take 200 mg by mouth daily.  06/08/13  Yes Historical Provider, MD  Melatonin 3 MG TABS Take 9 mg by mouth at bedtime.   Yes Historical Provider, MD  metoprolol tartrate (LOPRESSOR) 25 MG tablet Take 12.5 mg by mouth 2 (two) times daily. 8am and 1pm   Yes Historical Provider, MD  multivitamin-lutein (OCUVITE-LUTEIN) CAPS capsule Take 1 capsule by mouth at bedtime.    Yes Historical Provider, MD  oxyCODONE (OXY IR/ROXICODONE) 5 MG immediate release tablet Take 5-10 mg by mouth 2 (two) times daily as needed for severe pain.   Yes Historical Provider, MD  predniSONE (DELTASONE) 50 MG tablet  Take 50 mg by mouth See admin instructions. Take prior to contrast dye administration:  1 tablet (50 mg) 13 hours before procedure, 1 tablet 7 hours before and 1 tablet 1 hour before 05/29/13  Yes Historical Provider, MD   Physical Exam: Filed Vitals:   06/10/13 1359 06/10/13 1400  BP: 139/73 143/73  Pulse: 92 25  Temp: 98.5 F (36.9 C)   TempSrc: Oral   Resp: 18 14  SpO2: 98% 99%     General:  Awake, in nad  Eyes: PERRL B  ENT: membranes moist, dentition fair  Neck: trachea midline, neck supple  Cardiovascular: regular, s1, s2  Respiratory: normal resp effort, no wheezing  Abdomen: soft, nondistended  Skin: normal skin turgor, no abnormal skin lesions seen  Musculoskeletal: perfused, no clubbing  Psychiatric: mood/affect normal // no auditory/visual hallucinations  Neurologic: cn2-12 grossly intact, strength/sensation intact  Labs on Admission:  Basic Metabolic Panel:  Recent Labs Lab 06/10/13 1440  NA 139  K 4.0  CL 104  CO2 17*  GLUCOSE 89  BUN 23  CREATININE 0.88  CALCIUM 8.9   Liver Function Tests: No results found for this basename: AST, ALT, ALKPHOS, BILITOT, PROT, ALBUMIN,  in the last 168 hours No results found for this basename: LIPASE, AMYLASE,  in the last 168 hours No results found for this basename: AMMONIA,  in the last 168 hours CBC:  Recent Labs Lab 06/10/13 1440  WBC 2.5*  NEUTROABS 0.8*  HGB 11.1*  HCT 32.9*  MCV 80.8  PLT 167   Cardiac Enzymes:  Recent Labs Lab 06/10/13 1440  TROPONINI <0.30    BNP (last 3 results) No results found for this basename: PROBNP,  in the last 8760 hours CBG: No results found for this basename: GLUCAP,  in the last 168 hours  Radiological Exams on Admission: Dg Chest 1 View  06/10/2013   CLINICAL DATA:  Fall.  Right hip fracture  EXAM: CHEST - 1 VIEW  COMPARISON:  02/24/2013  FINDINGS: Prior cardiac surgery. Aortic stent graft. Mild cardiac enlargement without heart failure. Lungs are  clear  IMPRESSION: No active disease.   Electronically Signed   By: Marlan Palau M.D.   On: 06/10/2013 15:24   Dg Hip Complete Right  06/10/2013   CLINICAL DATA:  Fall  EXAM: RIGHT HIP - COMPLETE 2+ VIEW  COMPARISON:  None.  FINDINGS: Subcapital femoral neck fracture with mild impaction and minimal displacement. Right hip joint appears satisfactory.  Aortobifemoral stent graft placement.  Arterial calcification.  IMPRESSION: Subcapital femoral neck  fracture on the right.   Electronically Signed   By: Franchot Gallo M.D.   On: 06/10/2013 15:16   Ct Head Wo Contrast  06/10/2013   CLINICAL DATA:  Headache and neck pain  EXAM: CT HEAD WITHOUT CONTRAST  CT CERVICAL SPINE WITHOUT CONTRAST  TECHNIQUE: Multidetector CT imaging of the head and cervical spine was performed following the standard protocol without intravenous contrast. Multiplanar CT image reconstructions of the cervical spine were also generated.  COMPARISON:  None.  FINDINGS: CT HEAD FINDINGS  Advanced brain atrophy with associated ventricular enlargement. No acute intracranial hemorrhage, definite infarction, mass lesion, midline shift, herniation, or extra-axial fluid collection. No focal mass effect or edema. Normal gray-white matter differentiation. Cisterns patent. Cerebellar atrophy as well. Atherosclerosis of the intracranial vessels. Mastoids and sinuses are clear. No skull abnormality.  CT CERVICAL SPINE FINDINGS  Advanced cervical spondylosis and degenerative change at all levels. C5-6 solid bony fusion noted. No acute fracture, malalignment, subluxation, or dislocation. Diffuse facet arthropathy as well. Facets remain aligned. Normal prevertebral soft tissues. Carotid calcifications noted. No soft tissue asymmetry in the neck. Clear lung apices.  IMPRESSION: Advanced brain atrophy.  No acute intracranial finding  Advanced cervical degenerative changes and spondylosis as described. No acute cervical spine fracture or abnormality by CT.    Electronically Signed   By: Daryll Brod M.D.   On: 06/10/2013 15:41   Ct Cervical Spine Wo Contrast  06/10/2013   CLINICAL DATA:  Headache and neck pain  EXAM: CT HEAD WITHOUT CONTRAST  CT CERVICAL SPINE WITHOUT CONTRAST  TECHNIQUE: Multidetector CT imaging of the head and cervical spine was performed following the standard protocol without intravenous contrast. Multiplanar CT image reconstructions of the cervical spine were also generated.  COMPARISON:  None.  FINDINGS: CT HEAD FINDINGS  Advanced brain atrophy with associated ventricular enlargement. No acute intracranial hemorrhage, definite infarction, mass lesion, midline shift, herniation, or extra-axial fluid collection. No focal mass effect or edema. Normal gray-white matter differentiation. Cisterns patent. Cerebellar atrophy as well. Atherosclerosis of the intracranial vessels. Mastoids and sinuses are clear. No skull abnormality.  CT CERVICAL SPINE FINDINGS  Advanced cervical spondylosis and degenerative change at all levels. C5-6 solid bony fusion noted. No acute fracture, malalignment, subluxation, or dislocation. Diffuse facet arthropathy as well. Facets remain aligned. Normal prevertebral soft tissues. Carotid calcifications noted. No soft tissue asymmetry in the neck. Clear lung apices.  IMPRESSION: Advanced brain atrophy.  No acute intracranial finding  Advanced cervical degenerative changes and spondylosis as described. No acute cervical spine fracture or abnormality by CT.   Electronically Signed   By: Daryll Brod M.D.   On: 06/10/2013 15:41    EKG: Independently reviewed. NSR  Assessment/Plan Principal Problem:   Hip fracture Active Problems:   S/P CABG (coronary artery bypass graft)   H/O aortic valve replacement   Essential hypertension   Hyperlipidemia   1. Hip fracture 1. Orthopedic surgery consulted 2. Blood work, vitals, CXR,and EKG reviewed. Unremarkable 3. Pt is asymptomatic. No cp, sob, palpitations 4. Pt is s/p  recent AAA repair at [facility] with no reported complications. As such, it is likely, pt had cardiac clearance for the invasive procedure 5. Will obtain records from [facility]. Likely pt's benefits to surgery will be lower than risks involved 6. For now, pain mgt and supportive care 7. Will hold plavix for now, pending possible surgery 8. Admit to med-tele 2. CAD 1. Asymptomatic 2. EKG unremarkable 3. Currently on plavix - consider holding prior to  pending surgery 4. Cont other home meds 3. Hx AAA repair 1. Done about 2 months ago at [facility] 2. Will obtain records 4. HLD 1. Cont home statin 5. HTN 1. BP stable 2. Cong metoprolol, amlodipine 6. DVT prophylaxis 1. Heparin subQ  Code Status: Full Family Communication: Pt in room Disposition Plan: Pending   Time spent: 26min  Brett Soza, [facility] Pager [phone]  If 7PM-7AM, please contact night-coverage [email or web address] Password [facility] 06/10/2013, 4:44 PM

## 2013-06-10 NOTE — ED Notes (Signed)
Patient from home with c/o multiple falls x 1 week. Reports pain today to right hip, left knee, right ankle, and neck from witnessed fall on Sat. Denies LOC. Placed in c-collar. Pt experienced another fall today with no new injury; ambulatory with EMS. Neuro intact. A/O x4. BP-150/60, HR- 88 irregular- takes plavix, RR- 18.

## 2013-06-10 NOTE — Consult Note (Signed)
Reason for Consult: Right hip fracture Referring Physician:  Wyline Copas, MD  Robert Mueller. is an 80 y.o. male.  HPI: 80 yo male with multiple medical issues who currently resides with his daughter his HCPOA.  He has had a couple falls recently but after the last fall his pain had increased.  They brought him to the ER where work up revealed right femoral neck fracture No other injuries reported.  Past Medical History  Diagnosis Date  . Coronary artery disease   . S/P CABG (coronary artery bypass graft)     x 2  . Hypertension   . Hyperlipidemia   . Abdominal aortic aneurysm     7.3 cm x 7.4 cm  . Stroke   . Cancer     prostate  . Stomach problems   . Arthritis     Gout  . GERD (gastroesophageal reflux disease)   . Mild aortic stenosis   . CHF (congestive heart failure)   . Chronic kidney disease     Stage II    Past Surgical History  Procedure Laterality Date  . Hernia repair      4  . Replacement total knee    . Prostatectomy    . Coronary artery bypass graft    . Bypass graft      Family History  Problem Relation Age of Onset  . Cancer Mother   . Cirrhosis Neg Hx     family member passed away at 69    Social History:  reports that he quit smoking about 25 years ago. His smoking use included Cigars. He has quit using smokeless tobacco. His smokeless tobacco use included Chew. He reports that he drinks alcohol. He reports that he does not use illicit drugs.  Allergies:  Allergies  Allergen Reactions  . Amoxicillin Other (See Comments)    Joint swelling  . Metrizamide Hives    Hives in 1955 during ivp, OK W/ 13 HR PREP ON 10-15-12//A.CALHOUN  . Penicillins Other (See Comments)    Joint swelling  . Contrast Media [Iodinated Diagnostic Agents] Hives    Hives in 1955 during ivp, OK W/ 13 HR PREP ON 10-15-12//A.CALHOUN    Medications:  I have reviewed the patient's current medications. Scheduled: . acetaminophen  650 mg Oral Once  . [START ON 06/11/2013]  allopurinol  100 mg Oral 2 times per day  . [START ON 06/11/2013] amLODipine  5 mg Oral Daily  . aspirin EC  81 mg Oral QHS  . [START ON 06/11/2013] atorvastatin  20 mg Oral Daily  . FLUoxetine  20 mg Oral 2 times per day  . heparin  5,000 Units Subcutaneous 3 times per day  . lisinopril  20 mg Oral Daily  . megestrol  200 mg Oral Daily  . metoprolol tartrate  12.5 mg Oral BID  .  morphine injection  4 mg Intravenous Once  . multivitamin-lutein  1 capsule Oral QHS  . sodium chloride  3 mL Intravenous Q12H    Results for orders placed during the hospital encounter of 06/10/13 (from the past 24 hour(s))  CBC WITH DIFFERENTIAL     Status: Abnormal   Collection Time    06/10/13  2:40 PM      Result Value Ref Range   WBC 2.5 (*) 4.0 - 10.5 K/uL   RBC 4.07 (*) 4.22 - 5.81 MIL/uL   Hemoglobin 11.1 (*) 13.0 - 17.0 g/dL   HCT 32.9 (*) 39.0 - 52.0 %   MCV  80.8  78.0 - 100.0 fL   MCH 27.3  26.0 - 34.0 pg   MCHC 33.7  30.0 - 36.0 g/dL   RDW 15.8 (*) 11.5 - 15.5 %   Platelets 167  150 - 400 K/uL   Neutrophils Relative % 33 (*) 43 - 77 %   Lymphocytes Relative 41  12 - 46 %   Monocytes Relative 20 (*) 3 - 12 %   Eosinophils Relative 5  0 - 5 %   Basophils Relative 1  0 - 1 %   Neutro Abs 0.8 (*) 1.7 - 7.7 K/uL   Lymphs Abs 1.1  0.7 - 4.0 K/uL   Monocytes Absolute 0.5  0.1 - 1.0 K/uL   Eosinophils Absolute 0.1  0.0 - 0.7 K/uL   Basophils Absolute 0.0  0.0 - 0.1 K/uL   RBC Morphology POLYCHROMASIA PRESENT    BASIC METABOLIC PANEL     Status: Abnormal   Collection Time    06/10/13  2:40 PM      Result Value Ref Range   Sodium 139  137 - 147 mEq/L   Potassium 4.0  3.7 - 5.3 mEq/L   Chloride 104  96 - 112 mEq/L   CO2 17 (*) 19 - 32 mEq/L   Glucose, Bld 89  70 - 99 mg/dL   BUN 23  6 - 23 mg/dL   Creatinine, Ser 0.88  0.50 - 1.35 mg/dL   Calcium 8.9  8.4 - 10.5 mg/dL   GFR calc non Af Amer 80 (*) >90 mL/min   GFR calc Af Amer >90  >90 mL/min  TROPONIN I     Status: None   Collection Time     06/10/13  2:40 PM      Result Value Ref Range   Troponin I <0.30  <0.30 ng/mL  CBC     Status: Abnormal   Collection Time    06/10/13  5:45 PM      Result Value Ref Range   WBC 2.3 (*) 4.0 - 10.5 K/uL   RBC 3.82 (*) 4.22 - 5.81 MIL/uL   Hemoglobin 10.3 (*) 13.0 - 17.0 g/dL   HCT 30.8 (*) 39.0 - 52.0 %   MCV 80.6  78.0 - 100.0 fL   MCH 27.0  26.0 - 34.0 pg   MCHC 33.4  30.0 - 36.0 g/dL   RDW 15.8 (*) 11.5 - 15.5 %   Platelets 165  150 - 400 K/uL  CREATININE, SERUM     Status: Abnormal   Collection Time    06/10/13  5:45 PM      Result Value Ref Range   Creatinine, Ser 0.76  0.50 - 1.35 mg/dL   GFR calc non Af Amer 85 (*) >90 mL/min   GFR calc Af Amer >90  >90 mL/min    X-ray: CLINICAL DATA: Fall  EXAM:  RIGHT HIP - COMPLETE 2+ VIEW  COMPARISON: None.  FINDINGS:  Subcapital femoral neck fracture with mild impaction and minimal  displacement. Right hip joint appears satisfactory.  Aortobifemoral stent graft placement. Arterial calcification.  IMPRESSION:  Subcapital femoral neck fracture on the right.    ROS Recent surgeries at [facility], AAA, hiatal hernia repair Generalized weakness, deconditioning Some memory loss noted by family Recent falls associated with weakness   Blood pressure 135/75, pulse 98, temperature 98.9 F (37.2 C), temperature source Oral, resp. rate 12, SpO2 100.00%.  Physical Exam Awake alert, daughter present and asks pertinent probing questions Right hip pain with  motion NVI  Generalized medical exam deferred to medical admission  Assessment/Plan: Right femoral neck fracture After reviewing the situation with the patient and his daughter the plan is to proceed with right hemi-arthroplasty once cleared Or to OR by Thursday  Pros and cons of various approaches discussed and reviewed, hemiarthroplasty option was selected Consent ordered and to be signed by his daughter  Mauri Pole 06/10/2013, 10:50 PM

## 2013-06-11 DIAGNOSIS — F0280 Dementia in other diseases classified elsewhere without behavioral disturbance: Secondary | ICD-10-CM

## 2013-06-11 DIAGNOSIS — G3183 Dementia with Lewy bodies: Secondary | ICD-10-CM

## 2013-06-11 LAB — COMPREHENSIVE METABOLIC PANEL
ALK PHOS: 88 U/L (ref 39–117)
ALT: 7 U/L (ref 0–53)
AST: 13 U/L (ref 0–37)
Albumin: 2.7 g/dL — ABNORMAL LOW (ref 3.5–5.2)
BUN: 22 mg/dL (ref 6–23)
CO2: 19 meq/L (ref 19–32)
Calcium: 8.3 mg/dL — ABNORMAL LOW (ref 8.4–10.5)
Chloride: 107 mEq/L (ref 96–112)
Creatinine, Ser: 0.81 mg/dL (ref 0.50–1.35)
GFR, EST NON AFRICAN AMERICAN: 82 mL/min — AB (ref >90)
GLUCOSE: 81 mg/dL (ref 70–99)
POTASSIUM: 3.7 meq/L (ref 3.7–5.3)
Sodium: 140 mEq/L (ref 137–147)
Total Bilirubin: 0.5 mg/dL (ref 0.3–1.2)
Total Protein: 5.7 g/dL — ABNORMAL LOW (ref 6.0–8.3)

## 2013-06-11 LAB — CBC
HCT: 29.2 % — ABNORMAL LOW (ref 39.0–52.0)
Hemoglobin: 9.7 g/dL — ABNORMAL LOW (ref 13.0–17.0)
MCH: 27 pg (ref 26.0–34.0)
MCHC: 33.2 g/dL (ref 30.0–36.0)
MCV: 81.3 fL (ref 78.0–100.0)
Platelets: 163 10*3/uL (ref 150–400)
RBC: 3.59 MIL/uL — ABNORMAL LOW (ref 4.22–5.81)
RDW: 15.8 % — AB (ref 11.5–15.5)
WBC: 2.1 10*3/uL — ABNORMAL LOW (ref 4.0–10.5)

## 2013-06-11 LAB — MRSA PCR SCREENING: MRSA by PCR: NEGATIVE

## 2013-06-11 LAB — MAGNESIUM: Magnesium: 1.9 mg/dL (ref 1.5–2.5)

## 2013-06-11 MED ORDER — ZOLPIDEM TARTRATE 5 MG PO TABS
5.0000 mg | ORAL_TABLET | Freq: Every evening | ORAL | Status: DC | PRN
  Administered 2013-06-11: 5 mg via ORAL
  Filled 2013-06-11: qty 1

## 2013-06-11 NOTE — Progress Notes (Addendum)
PROGRESS NOTE  Robert Mueller. WUJ:[id number] DOB: 10/31/1931 DOA: 06/10/2013 PCP: No PCP Per Patient  Patient with a hx of prior cva, CAD s/p CABG, HTN, AAA, htn, chf who presents to the hospital s/p mechanical fall resulting in hip pain that occurred on 4/3. Pt noted pain in the R hip but did not go to hospital. Instead noted worsening pain and gait, resulting in ED visit today. In the Ed, CT head was unremarkable. Imaging of the hips however, demonstrated a R subcapital femoral neck fracture. Orthopedic surgery was consulted  Assessment/Plan: Hip fracture  Orthopedic surgery consulted Blood work, vitals, CXR,and EKG reviewed. Unremarkable Pt is asymptomatic. No cp, sob, palpitations Pt is s/p recent AAA repair at [facility] with no reported complications. pt had cardiac clearance for the invasive procedure- will touch base with cardiology here- Dr. Gwenlyn Found from 10/2012 said: Low risk with inferior scar w/o ischemia on stress test For now, pain mgt and supportive care Will hold plavix for now, pending possible surgery Tele  CAD  Asymptomatic EKG unremarkable Currently on plavix - consider holding prior to pending surgery Cont other home meds  Hx AAA repair  Done about 2 months ago at [facility] Will obtain records  HLD  Cont home statin   HTN  BP stable Cont metoprolol, amlodipine  Dementia -patient is functional- but was not sure where his surgery was- insisted it was [facility]  Code Status: full Family Communication: patient; daughter on phone Disposition Plan:    Consultants:  ortho  Procedures:      HPI/Subjective: Patient is pleasantly confused- states surgery done at [facility] he gets around without chest pain- able to climb stairs  Objective: Filed Vitals:   06/11/13 0453  BP: 150/80  Pulse: 69  Temp: 97.6 F (36.4 C)  Resp: 18    Intake/Output Summary (Last 24 hours) at 06/11/13 0851 Last data filed at 06/11/13 0500  Gross per 24 hour  Intake    240 ml    Output      0 ml  Net    240 ml   Filed Weights   06/10/13 2300  Weight: 78.1 kg (172 lb 2.9 oz)    Exam:   General:  Pleasant- can answer questions- not all approriate  Cardiovascular: rrr  Respiratory: clear anterior  Abdomen: +BS, soft  Musculoskeletal: moves all 4 ext   Data Reviewed: Basic Metabolic Panel:  Recent Labs Lab 06/10/13 1440 06/10/13 1745 06/11/13 0540  NA 139  --  140  K 4.0  --  3.7  CL 104  --  107  CO2 17*  --  19  GLUCOSE 89  --  81  BUN 23  --  22  CREATININE 0.88 0.76 0.81  CALCIUM 8.9  --  8.3*  MG  --   --  1.9   Liver Function Tests:  Recent Labs Lab 06/11/13 0540  AST 13  ALT 7  ALKPHOS 88  BILITOT 0.5  PROT 5.7*  ALBUMIN 2.7*   No results found for this basename: LIPASE, AMYLASE,  in the last 168 hours No results found for this basename: AMMONIA,  in the last 168 hours CBC:  Recent Labs Lab 06/10/13 1440 06/10/13 1745 06/11/13 0540  WBC 2.5* 2.3* 2.1*  NEUTROABS 0.8*  --   --   HGB 11.1* 10.3* 9.7*  HCT 32.9* 30.8* 29.2*  MCV 80.8 80.6 81.3  PLT 167 165 163   Cardiac Enzymes:  Recent Labs Lab 06/10/13 1440  TROPONINI <0.30  BNP (last 3 results) No results found for this basename: PROBNP,  in the last 8760 hours CBG: No results found for this basename: GLUCAP,  in the last 168 hours  No results found for this or any previous visit (from the past 240 hour(s)).   Studies: Dg Chest 1 View  06/10/2013   CLINICAL DATA:  Fall.  Right hip fracture  EXAM: CHEST - 1 VIEW  COMPARISON:  02/24/2013  FINDINGS: Prior cardiac surgery. Aortic stent graft. Mild cardiac enlargement without heart failure. Lungs are clear  IMPRESSION: No active disease.   Electronically Signed   By: Franchot Gallo M.D.   On: 06/10/2013 15:24   Dg Hip Complete Right  06/10/2013   CLINICAL DATA:  Fall  EXAM: RIGHT HIP - COMPLETE 2+ VIEW  COMPARISON:  None.  FINDINGS: Subcapital femoral neck fracture with mild impaction and minimal  displacement. Right hip joint appears satisfactory.  Aortobifemoral stent graft placement.  Arterial calcification.  IMPRESSION: Subcapital femoral neck fracture on the right.   Electronically Signed   By: Franchot Gallo M.D.   On: 06/10/2013 15:16   Ct Head Wo Contrast  06/10/2013   CLINICAL DATA:  Headache and neck pain  EXAM: CT HEAD WITHOUT CONTRAST  CT CERVICAL SPINE WITHOUT CONTRAST  TECHNIQUE: Multidetector CT imaging of the head and cervical spine was performed following the standard protocol without intravenous contrast. Multiplanar CT image reconstructions of the cervical spine were also generated.  COMPARISON:  None.  FINDINGS: CT HEAD FINDINGS  Advanced brain atrophy with associated ventricular enlargement. No acute intracranial hemorrhage, definite infarction, mass lesion, midline shift, herniation, or extra-axial fluid collection. No focal mass effect or edema. Normal gray-white matter differentiation. Cisterns patent. Cerebellar atrophy as well. Atherosclerosis of the intracranial vessels. Mastoids and sinuses are clear. No skull abnormality.  CT CERVICAL SPINE FINDINGS  Advanced cervical spondylosis and degenerative change at all levels. C5-6 solid bony fusion noted. No acute fracture, malalignment, subluxation, or dislocation. Diffuse facet arthropathy as well. Facets remain aligned. Normal prevertebral soft tissues. Carotid calcifications noted. No soft tissue asymmetry in the neck. Clear lung apices.  IMPRESSION: Advanced brain atrophy.  No acute intracranial finding  Advanced cervical degenerative changes and spondylosis as described. No acute cervical spine fracture or abnormality by CT.   Electronically Signed   By: Daryll Brod M.D.   On: 06/10/2013 15:41   Ct Cervical Spine Wo Contrast  06/10/2013   CLINICAL DATA:  Headache and neck pain  EXAM: CT HEAD WITHOUT CONTRAST  CT CERVICAL SPINE WITHOUT CONTRAST  TECHNIQUE: Multidetector CT imaging of the head and cervical spine was performed  following the standard protocol without intravenous contrast. Multiplanar CT image reconstructions of the cervical spine were also generated.  COMPARISON:  None.  FINDINGS: CT HEAD FINDINGS  Advanced brain atrophy with associated ventricular enlargement. No acute intracranial hemorrhage, definite infarction, mass lesion, midline shift, herniation, or extra-axial fluid collection. No focal mass effect or edema. Normal gray-white matter differentiation. Cisterns patent. Cerebellar atrophy as well. Atherosclerosis of the intracranial vessels. Mastoids and sinuses are clear. No skull abnormality.  CT CERVICAL SPINE FINDINGS  Advanced cervical spondylosis and degenerative change at all levels. C5-6 solid bony fusion noted. No acute fracture, malalignment, subluxation, or dislocation. Diffuse facet arthropathy as well. Facets remain aligned. Normal prevertebral soft tissues. Carotid calcifications noted. No soft tissue asymmetry in the neck. Clear lung apices.  IMPRESSION: Advanced brain atrophy.  No acute intracranial finding  Advanced cervical degenerative changes and spondylosis as  described. No acute cervical spine fracture or abnormality by CT.   Electronically Signed   By: Daryll Brod M.D.   On: 06/10/2013 15:41    Scheduled Meds: . acetaminophen  650 mg Oral Once  . allopurinol  100 mg Oral 2 times per day  . amLODipine  5 mg Oral Daily  . aspirin EC  81 mg Oral QHS  . atorvastatin  20 mg Oral Daily  . FLUoxetine  20 mg Oral 2 times per day  . heparin  5,000 Units Subcutaneous 3 times per day  . lisinopril  20 mg Oral Daily  . megestrol  200 mg Oral Daily  . metoprolol tartrate  12.5 mg Oral BID  .  morphine injection  4 mg Intravenous Once  . multivitamin-lutein  1 capsule Oral QHS  . sodium chloride  3 mL Intravenous Q12H   Continuous Infusions: . sodium chloride    . sodium chloride 125 mL/hr at 06/10/13 1653   Antibiotics Given (last 72 hours)   None      Principal Problem:   Hip  fracture Active Problems:   S/P CABG (coronary artery bypass graft)   H/O aortic valve replacement   Essential hypertension   Hyperlipidemia    Time spent: [facility] Pager [phone]. If 7PM-7AM, please contact night-coverage at [email or web address], password [facility] 06/11/2013, 8:51 AM  LOS: 1 day

## 2013-06-11 NOTE — Progress Notes (Signed)
INITIAL NUTRITION ASSESSMENT  DOCUMENTATION CODES Per approved criteria  -Severe malnutrition in the context of chronic illness   INTERVENTION: Offer ensure once diet advanced.   NUTRITION DIAGNOSIS: Malnutrition related to multiple hospitalizations and decreased appetite as evidenced by 8% weight loss x 1 month and poor severe muscle wasting.   Goal: Pt to meet >/= 90% of their estimated nutrition needs   Monitor:  PO intake, weight trends  Reason for Assessment: Pt identified as at nutrition risk on the Malnutrition Screen Tool  80 y.o. male  Admitting Dx: Hip fracture  ASSESSMENT: Pt admitted from home with hip fx, surgery pending. Pt somewhat confused per staff. Son-in-law provided hx and reports that pt has had several surgeries and been hospitalized for the last 3-4 weeks. Pt has had a decreased appetite in that time contributing to his weight loss.   Nutrition Focused Physical Exam:  Subcutaneous Fat:  Orbital Region: WNL Upper Arm Region: WNL Thoracic and Lumbar Region: moderate wasting  Muscle:  Temple Region: WNL Clavicle Bone Region: moderate wasting Clavicle and Acromion Bone Region: WNL Scapular Bone Region: NA Dorsal Hand: severe wasting Patellar Region: WNl Anterior Thigh Region: WNl Posterior Calf Region: WNl  Edema: not present   Height: Ht Readings from Last 1 Encounters:  06/10/13 6' (1.829 m)    Weight: Wt Readings from Last 1 Encounters:  06/10/13 172 lb 2.9 oz (78.1 kg)    Ideal Body Weight: 80.9 kg   % Ideal Body Weight: 97%  Wt Readings from Last 10 Encounters:  06/10/13 172 lb 2.9 oz (78.1 kg)  04/03/13 178 lb (80.74 kg)  02/24/13 200 lb (90.719 kg)  10/17/12 194 lb (87.998 kg)  10/14/12 196 lb (88.905 kg)  10/10/12 194 lb (87.998 kg)    Usual Body Weight: 186 lb   % Usual Body Weight: 92%  BMI:  Body mass index is 23.35 kg/(m^2).  Estimated Nutritional Needs: Kcal: 2000-2200 Protein: 85-95 gramsn Fluid: > 2  L/day  Skin: old healed PEG site  Diet Order: General  EDUCATION NEEDS: -No education needs identified at this time   Intake/Output Summary (Last 24 hours) at 06/11/13 0823 Last data filed at 06/11/13 0500  Gross per 24 hour  Intake    240 ml  Output      0 ml  Net    240 ml    Last BM: 4/5   Labs:   Recent Labs Lab 06/10/13 1440 06/10/13 1745 06/11/13 0540  NA 139  --  140  K 4.0  --  3.7  CL 104  --  107  CO2 17*  --  19  BUN 23  --  22  CREATININE 0.88 0.76 0.81  CALCIUM 8.9  --  8.3*  MG  --   --  1.9  GLUCOSE 89  --  81    CBG (last 3)  No results found for this basename: GLUCAP,  in the last 72 hours  Scheduled Meds: . acetaminophen  650 mg Oral Once  . allopurinol  100 mg Oral 2 times per day  . amLODipine  5 mg Oral Daily  . aspirin EC  81 mg Oral QHS  . atorvastatin  20 mg Oral Daily  . FLUoxetine  20 mg Oral 2 times per day  . heparin  5,000 Units Subcutaneous 3 times per day  . lisinopril  20 mg Oral Daily  . megestrol  200 mg Oral Daily  . metoprolol tartrate  12.5 mg Oral BID  .  morphine injection  4 mg Intravenous Once  . multivitamin-lutein  1 capsule Oral QHS  . sodium chloride  3 mL Intravenous Q12H    Continuous Infusions: . sodium chloride    . sodium chloride 125 mL/hr at 06/10/13 1653    Past Medical History  Diagnosis Date  . Coronary artery disease   . S/P CABG (coronary artery bypass graft)     x 2  . Hypertension   . Hyperlipidemia   . Abdominal aortic aneurysm     7.3 cm x 7.4 cm  . Stroke   . Cancer     prostate  . Stomach problems   . Arthritis     Gout  . GERD (gastroesophageal reflux disease)   . Mild aortic stenosis   . CHF (congestive heart failure)   . Chronic kidney disease     Stage II    Past Surgical History  Procedure Laterality Date  . Hernia repair      4  . Replacement total knee    . Prostatectomy    . Coronary artery bypass graft    . Bypass graft      Maylon Peppers RD, LDN,  [place] Pager [phone] After Hours Pager

## 2013-06-11 NOTE — Progress Notes (Signed)
Patient ID: Robert Rusk., male   DOB: 1931/12/21, 80 y.o.   MRN: [id number]  No events over night. Right hip fracture that will need to fixed for functional quality of life  NPO after midnight today for possible OR Thursday if cleared Consent ordered

## 2013-06-11 NOTE — Progress Notes (Signed)
Pt had a run of a flutter. Pt alert and oriented X4 with no complaints of pain or SOB. VS 150/80 T 97.6 HR 69 O2 100 RR 18. Pt states he feels "good". Pt converted back to sinus rhythm with multiple multifocal pair PVCs.  Provider on call paged, EKG done. Provider ordered [place] lab draw. Will continue to monitor.

## 2013-06-11 NOTE — Progress Notes (Signed)
UR completed. Angellynn Kimberlin RN CCM Case Mgmt phone [phone] 

## 2013-06-12 ENCOUNTER — Encounter (HOSPITAL_COMMUNITY): Payer: Medicare Other | Admitting: Anesthesiology

## 2013-06-12 ENCOUNTER — Encounter (HOSPITAL_COMMUNITY)
Admission: EM | Disposition: A | Discharge status: Skilled Nursing Facility | Payer: Self-pay | Source: Home / Self Care | Attending: Internal Medicine

## 2013-06-12 ENCOUNTER — Inpatient Hospital Stay (HOSPITAL_COMMUNITY): Payer: Medicare Other

## 2013-06-12 ENCOUNTER — Encounter (HOSPITAL_COMMUNITY): Payer: Self-pay | Admitting: Anesthesiology

## 2013-06-12 ENCOUNTER — Inpatient Hospital Stay (HOSPITAL_COMMUNITY): Payer: Medicare Other | Admitting: Anesthesiology

## 2013-06-12 HISTORY — PX: HIP ARTHROPLASTY: SHX981

## 2013-06-12 LAB — PREPARE RBC (CROSSMATCH)

## 2013-06-12 LAB — PATHOLOGIST SMEAR REVIEW

## 2013-06-12 LAB — ABO/RH: ABO/RH(D): A NEG

## 2013-06-12 SURGERY — HEMIARTHROPLASTY, HIP, DIRECT ANTERIOR APPROACH, FOR FRACTURE
Anesthesia: General | Site: Hip | Laterality: Right

## 2013-06-12 MED ORDER — HYDROMORPHONE HCL PF 1 MG/ML IJ SOLN
INTRAMUSCULAR | Status: AC
  Filled 2013-06-12: qty 1

## 2013-06-12 MED ORDER — NEOSTIGMINE METHYLSULFATE 1 MG/ML IJ SOLN
INTRAMUSCULAR | Status: DC | PRN
  Administered 2013-06-12: 4 mg via INTRAVENOUS

## 2013-06-12 MED ORDER — ENSURE COMPLETE PO LIQD
237.0000 mL | Freq: Two times a day (BID) | ORAL | Status: DC
  Administered 2013-06-13 – 2013-06-16 (×3): 237 mL via ORAL

## 2013-06-12 MED ORDER — CLINDAMYCIN PHOSPHATE 600 MG/50ML IV SOLN
600.0000 mg | Freq: Four times a day (QID) | INTRAVENOUS | Status: AC
  Administered 2013-06-13 (×2): 600 mg via INTRAVENOUS
  Filled 2013-06-12 (×2): qty 50

## 2013-06-12 MED ORDER — PHENOL 1.4 % MT LIQD: 1.0000 | OROMUCOSAL | Status: DC | PRN

## 2013-06-12 MED ORDER — MENTHOL 3 MG MT LOZG: 1.0000 | LOZENGE | OROMUCOSAL | Status: DC | PRN

## 2013-06-12 MED ORDER — LACTATED RINGERS IV SOLN
INTRAVENOUS | Status: DC | PRN
  Administered 2013-06-12 (×2): via INTRAVENOUS

## 2013-06-12 MED ORDER — ALBUMIN HUMAN 5 % IV SOLN
INTRAVENOUS | Status: DC | PRN
  Administered 2013-06-12: 19:00:00 via INTRAVENOUS

## 2013-06-12 MED ORDER — LIDOCAINE HCL (CARDIAC) 20 MG/ML IV SOLN
INTRAVENOUS | Status: DC | PRN
  Administered 2013-06-12: 50 mg via INTRAVENOUS

## 2013-06-12 MED ORDER — OXYCODONE HCL 5 MG PO TABS: 5.0000 mg | ORAL_TABLET | Freq: Once | ORAL | Status: DC | PRN

## 2013-06-12 MED ORDER — OXYCODONE HCL 5 MG PO TABS: 5.0000 mg | ORAL_TABLET | Freq: Two times a day (BID) | ORAL | Status: AC | PRN

## 2013-06-12 MED ORDER — CLINDAMYCIN PHOSPHATE 900 MG/50ML IV SOLN
900.0000 mg | Freq: Once | INTRAVENOUS | Status: DC
  Filled 2013-06-12: qty 50

## 2013-06-12 MED ORDER — DEXTROSE 5 % IV SOLN
10.0000 mg | INTRAVENOUS | Status: DC | PRN
Start: 2013-06-12 — End: 2013-06-12
  Administered 2013-06-12: 15 ug/min via INTRAVENOUS

## 2013-06-12 MED ORDER — GLYCOPYRROLATE 0.2 MG/ML IJ SOLN
INTRAMUSCULAR | Status: DC | PRN
  Administered 2013-06-12: .7 mg via INTRAVENOUS

## 2013-06-12 MED ORDER — OXYCODONE HCL 5 MG/5ML PO SOLN: 5.0000 mg | Freq: Once | ORAL | Status: DC | PRN

## 2013-06-12 MED ORDER — HYDROMORPHONE HCL PF 1 MG/ML IJ SOLN
0.2500 mg | INTRAMUSCULAR | Status: DC | PRN
  Administered 2013-06-12: 0.5 mg via INTRAVENOUS

## 2013-06-12 MED ORDER — FERROUS SULFATE 325 (65 FE) MG PO TABS
325.0000 mg | ORAL_TABLET | Freq: Three times a day (TID) | ORAL | Status: DC
  Administered 2013-06-13 – 2013-06-17 (×12): 325 mg via ORAL
  Filled 2013-06-12 (×16): qty 1

## 2013-06-12 MED ORDER — CLOPIDOGREL BISULFATE 75 MG PO TABS
75.0000 mg | ORAL_TABLET | Freq: Every day | ORAL | Status: DC
  Administered 2013-06-13 – 2013-06-17 (×5): 75 mg via ORAL
  Filled 2013-06-12 (×7): qty 1

## 2013-06-12 MED ORDER — POLYETHYLENE GLYCOL 3350 17 G PO PACK
17.0000 g | PACK | Freq: Two times a day (BID) | ORAL | Status: DC
  Administered 2013-06-12 – 2013-06-17 (×10): 17 g via ORAL
  Filled 2013-06-12 (×12): qty 1

## 2013-06-12 MED ORDER — PROPOFOL 10 MG/ML IV BOLUS
INTRAVENOUS | Status: DC | PRN
  Administered 2013-06-12: 120 mg via INTRAVENOUS

## 2013-06-12 MED ORDER — CLINDAMYCIN PHOSPHATE 900 MG/50ML IV SOLN
900.0000 mg | INTRAVENOUS | Status: AC
  Administered 2013-06-12: 900 mg via INTRAVENOUS
  Filled 2013-06-12: qty 50

## 2013-06-12 MED ORDER — PROPOFOL 10 MG/ML IV BOLUS
INTRAVENOUS | Status: AC
  Filled 2013-06-12: qty 20

## 2013-06-12 MED ORDER — ONDANSETRON HCL 4 MG/2ML IJ SOLN
INTRAMUSCULAR | Status: DC | PRN
  Administered 2013-06-12: 4 mg via INTRAVENOUS

## 2013-06-12 MED ORDER — FENTANYL CITRATE 0.05 MG/ML IJ SOLN
INTRAMUSCULAR | Status: AC
  Filled 2013-06-12: qty 5

## 2013-06-12 MED ORDER — METOCLOPRAMIDE HCL 5 MG/ML IJ SOLN: 10.0000 mg | Freq: Once | INTRAMUSCULAR | Status: DC | PRN

## 2013-06-12 MED ORDER — FENTANYL CITRATE 0.05 MG/ML IJ SOLN
INTRAMUSCULAR | Status: DC | PRN
  Administered 2013-06-12: 150 ug via INTRAVENOUS

## 2013-06-12 MED ORDER — SODIUM CHLORIDE 0.9 % IR SOLN
Status: DC | PRN
  Administered 2013-06-12: 100 mL
  Administered 2013-06-12: 200 mL

## 2013-06-12 MED ORDER — ROCURONIUM BROMIDE 100 MG/10ML IV SOLN
INTRAVENOUS | Status: DC | PRN
  Administered 2013-06-12: 40 mg via INTRAVENOUS

## 2013-06-12 MED ORDER — DOCUSATE SODIUM 100 MG PO CAPS
100.0000 mg | ORAL_CAPSULE | Freq: Two times a day (BID) | ORAL | Status: DC
  Administered 2013-06-12 – 2013-06-17 (×10): 100 mg via ORAL
  Filled 2013-06-12 (×10): qty 1

## 2013-06-12 SURGICAL SUPPLY — 49 items
BLADE SAW SGTL 18X1.27X75 (BLADE) ×2 IMPLANT
BLADE SAW SGTL 18X1.27X75MM (BLADE) ×1
CAPT HIP FX BIPOLAR/UNIPOLAR ×3 IMPLANT
COVER SURGICAL LIGHT HANDLE (MISCELLANEOUS) ×3 IMPLANT
DERMABOND ADHESIVE PROPEN (GAUZE/BANDAGES/DRESSINGS) ×2
DERMABOND ADVANCED (GAUZE/BANDAGES/DRESSINGS) ×2
DERMABOND ADVANCED .7 DNX12 (GAUZE/BANDAGES/DRESSINGS) ×1 IMPLANT
DERMABOND ADVANCED .7 DNX6 (GAUZE/BANDAGES/DRESSINGS) ×1 IMPLANT
DRAPE INCISE IOBAN 85X60 (DRAPES) ×3 IMPLANT
DRAPE ORTHO SPLIT 77X108 STRL (DRAPES) ×4
DRAPE SURG ORHT 6 SPLT 77X108 (DRAPES) ×2 IMPLANT
DRAPE U-SHAPE 47X51 STRL (DRAPES) ×3 IMPLANT
DRSG AQUACEL AG ADV 3.5X10 (GAUZE/BANDAGES/DRESSINGS) ×3 IMPLANT
DURAPREP 26ML APPLICATOR (WOUND CARE) ×3 IMPLANT
ELECT BLADE 4.0 EZ CLEAN MEGAD (MISCELLANEOUS) ×3
ELECT REM PT RETURN 9FT ADLT (ELECTROSURGICAL) ×3
ELECTRODE BLDE 4.0 EZ CLN MEGD (MISCELLANEOUS) ×1 IMPLANT
ELECTRODE REM PT RTRN 9FT ADLT (ELECTROSURGICAL) ×1 IMPLANT
EVACUATOR 1/8 PVC DRAIN (DRAIN) IMPLANT
FACESHIELD WRAPAROUND (MASK) ×3 IMPLANT
GLOVE BIOGEL PI IND STRL 7.5 (GLOVE) ×1 IMPLANT
GLOVE BIOGEL PI IND STRL 8 (GLOVE) ×2 IMPLANT
GLOVE BIOGEL PI INDICATOR 7.5 (GLOVE) ×2
GLOVE BIOGEL PI INDICATOR 8 (GLOVE) ×4
GLOVE ECLIPSE 8.0 STRL XLNG CF (GLOVE) ×3 IMPLANT
GLOVE ORTHO TXT STRL SZ7.5 (GLOVE) ×6 IMPLANT
GLOVE SURG ORTHO 8.0 STRL STRW (GLOVE) ×3 IMPLANT
GOWN STRL REIN 3XL XLG LVL4 (GOWN DISPOSABLE) ×3 IMPLANT
GOWN STRL REUS W/ TWL LRG LVL3 (GOWN DISPOSABLE) ×3 IMPLANT
GOWN STRL REUS W/TWL LRG LVL3 (GOWN DISPOSABLE) ×6
HANDPIECE INTERPULSE COAX TIP (DISPOSABLE)
IMMOBILIZER KNEE 22 UNIV (SOFTGOODS) ×3 IMPLANT
KIT BASIN OR (CUSTOM PROCEDURE TRAY) ×3 IMPLANT
KIT ROOM TURNOVER OR (KITS) ×3 IMPLANT
MANIFOLD NEPTUNE II (INSTRUMENTS) IMPLANT
NS IRRIG 1000ML POUR BTL (IV SOLUTION) ×3 IMPLANT
PACK TOTAL JOINT (CUSTOM PROCEDURE TRAY) ×3 IMPLANT
PAD ARMBOARD 7.5X6 YLW CONV (MISCELLANEOUS) ×12 IMPLANT
SET HNDPC FAN SPRY TIP SCT (DISPOSABLE) IMPLANT
SPONGE LAP 4X18 X RAY DECT (DISPOSABLE) ×6 IMPLANT
SUT MNCRL AB 4-0 PS2 18 (SUTURE) ×3 IMPLANT
SUT VIC AB 1 CT1 27 (SUTURE) ×4
SUT VIC AB 1 CT1 27XBRD ANBCTR (SUTURE) ×2 IMPLANT
SUT VIC AB 2-0 CT1 27 (SUTURE) ×4
SUT VIC AB 2-0 CT1 TAPERPNT 27 (SUTURE) ×2 IMPLANT
TOWEL OR 17X24 6PK STRL BLUE (TOWEL DISPOSABLE) ×3 IMPLANT
TOWEL OR 17X26 10 PK STRL BLUE (TOWEL DISPOSABLE) ×3 IMPLANT
TRAY FOLEY CATH 14FR (SET/KITS/TRAYS/PACK) IMPLANT
WATER STERILE IRR 1000ML POUR (IV SOLUTION) ×6 IMPLANT

## 2013-06-12 NOTE — Anesthesia Postprocedure Evaluation (Signed)
Anesthesia Post Note  Patient: Robert Mueller.  Procedure(s) Performed: Procedure(s) (LRB): ARTHROPLASTY BIPOLAR HIP (Right)  Anesthesia type: General  Patient location: PACU  Post pain: Pain level controlled  Post assessment: Patient's Cardiovascular Status Stable  Last Vitals:  Filed Vitals:   06/12/13 2115  BP: 143/70  Pulse: 69  Temp: 36.7 C  Resp: 17    Post vital signs: Reviewed and stable  Level of consciousness: alert  Complications: No apparent anesthesia complications

## 2013-06-12 NOTE — Anesthesia Preprocedure Evaluation (Signed)
Anesthesia Evaluation  Patient identified by MRN, date of birth, ID band Patient awake    Reviewed: Allergy & Precautions, H&P , NPO status   Airway       Dental   Pulmonary former smoker,          Cardiovascular hypertension, + CAD, + CABG, + Peripheral Vascular Disease and +CHF     Neuro/Psych  Neuromuscular disease CVA    GI/Hepatic GERD-  ,  Endo/Other    Renal/GU Renal InsufficiencyRenal disease     Musculoskeletal   Abdominal   Peds  Hematology  (+) anemia ,   Anesthesia Other Findings   Reproductive/Obstetrics                           Anesthesia Physical Anesthesia Plan  ASA: III  Anesthesia Plan: General   Post-op Pain Management:    Induction: Intravenous  Airway Management Planned: Oral ETT  Additional Equipment:   Intra-op Plan:   Post-operative Plan: Extubation in OR  Informed Consent: I have reviewed the patients History and Physical, chart, labs and discussed the procedure including the risks, benefits and alternatives for the proposed anesthesia with the patient or authorized representative who has indicated his/her understanding and acceptance.     Plan Discussed with: CRNA and Surgeon  Anesthesia Plan Comments:         Anesthesia Quick Evaluation

## 2013-06-12 NOTE — Progress Notes (Addendum)
Patient ID: Robert Rusk., male   DOB: 02-01-1932, 80 y.o.   MRN: [id number]  He remains stable at this point NPO  Pain with hip movement Plan to proceed with right hip hemiarthroplasty this pm for pain control and functional improvement  Hold or d/c heparin today for planned surgery, will restart DVT prophylaxis tomorrow POD #1

## 2013-06-12 NOTE — Progress Notes (Signed)
Skin tear present on rt forearm on admission to PACU. Gauze dressing soaked with saline and removed. Tegaderm placed over site then gauze pressure dressing placed over tegaderm. Moderate bleeding occurred when original gauze dressing removed.

## 2013-06-12 NOTE — Progress Notes (Signed)
PROGRESS NOTE  Robert Mueller. CVE:[id number] DOB: 1931/03/31 DOA: 06/10/2013 PCP: No PCP Per Patient  Patient with a hx of prior cva, CAD s/p CABG, HTN, AAA, htn, chf who presents to the hospital s/p mechanical fall resulting in hip pain that occurred on 4/3. Pt noted pain in the R hip but did not go to hospital. Instead noted worsening pain and gait, resulting in ED visit today. In the Ed, CT head was unremarkable. Imaging of the hips however, demonstrated a R subcapital femoral neck fracture. Orthopedic surgery was consulted  Assessment/Plan: Hip fracture  Cleared for OR. Pt is s/p recent AAA repair at [facility] with no reported complications. pt had cardiac clearance for the invasive procedure- will touch base with cardiology here- Dr. Gwenlyn Found from 10/2012 said: Low risk with inferior scar w/o ischemia on stress test Surgery later today. Holding Plavix for now.  CAD  Asymptomatic EKG unremarkable Currently on plavix - currently holding Cont other home meds  Hx AAA repair  Done about 2 months ago at [facility] Will obtain records  HLD  Cont home statin   HTN  BP stable Cont metoprolol, amlodipine  Dementia -patient is functional- but was not sure where his surgery was- insisted it was [facility]  ? History of factor V Leiden antibody: As mentioned by daughter. Will try to review old records. Patient may benefit from full anticoagulation after surgery  Code Status: full Family Communication: Daughter. at bedside Disposition Plan: Likely skilled nursing, likely Monday   Consultants:  ortho  Procedures:  For hip Repair today    HPI/Subjective: Patient currently doing well. No acute distress. Pain control.  Objective: Filed Vitals:   06/12/13 1434  BP: 154/73  Pulse: 73  Temp: 98 F (36.7 C)  Resp: 18    Intake/Output Summary (Last 24 hours) at 06/12/13 1436 Last data filed at 06/12/13 1435  Gross per 24 hour  Intake      0 ml  Output   1050 ml  Net  -1050 ml   Filed  Weights   06/10/13 2300  Weight: 78.1 kg (172 lb 2.9 oz)    Exam:   General:  Alert and oriented x2, no acute distress  Cardiovascular: Regular rate and rhythm, S1-S2  Respiratory: Clear to auscultation bilaterally  Abdomen: Soft, nontender, nondistended, positive bowel sounds  Musculoskeletal: No clubbing or cyanosis or edema  Data Reviewed: Basic Metabolic Panel:  Recent Labs Lab 06/10/13 1440 06/10/13 1745 06/11/13 0540  NA 139  --  140  K 4.0  --  3.7  CL 104  --  107  CO2 17*  --  19  GLUCOSE 89  --  81  BUN 23  --  22  CREATININE 0.88 0.76 0.81  CALCIUM 8.9  --  8.3*  MG  --   --  1.9   Liver Function Tests:  Recent Labs Lab 06/11/13 0540  AST 13  ALT 7  ALKPHOS 88  BILITOT 0.5  PROT 5.7*  ALBUMIN 2.7*   No results found for this basename: LIPASE, AMYLASE,  in the last 168 hours No results found for this basename: AMMONIA,  in the last 168 hours CBC:  Recent Labs Lab 06/10/13 1440 06/10/13 1745 06/11/13 0540  WBC 2.5* 2.3* 2.1*  NEUTROABS 0.8*  --   --   HGB 11.1* 10.3* 9.7*  HCT 32.9* 30.8* 29.2*  MCV 80.8 80.6 81.3  PLT 167 165 163   Cardiac Enzymes:  Recent Labs Lab 06/10/13 1440  TROPONINI <  0.30   BNP (last 3 results) No results found for this basename: PROBNP,  in the last 8760 hours CBG: No results found for this basename: GLUCAP,  in the last 168 hours  Recent Results (from the past 240 hour(s))  MRSA PCR SCREENING     Status: None   Collection Time    06/11/13  6:31 PM      Result Value Ref Range Status   MRSA by PCR NEGATIVE  NEGATIVE Final   Comment:            The GeneXpert MRSA Assay (FDA     approved for NASAL specimens     only), is one component of a     comprehensive MRSA colonization     surveillance program. It is not     intended to diagnose MRSA     infection nor to guide or     monitor treatment for     MRSA infections.     Studies: Dg Chest 1 View  06/10/2013   CLINICAL DATA:  Fall.  Right hip  fracture  EXAM: CHEST - 1 VIEW  COMPARISON:  02/24/2013  FINDINGS: Prior cardiac surgery. Aortic stent graft. Mild cardiac enlargement without heart failure. Lungs are clear  IMPRESSION: No active disease.   Electronically Signed   By: Franchot Gallo M.D.   On: 06/10/2013 15:24   Dg Hip Complete Right  06/10/2013   CLINICAL DATA:  Fall  EXAM: RIGHT HIP - COMPLETE 2+ VIEW  COMPARISON:  None.  FINDINGS: Subcapital femoral neck fracture with mild impaction and minimal displacement. Right hip joint appears satisfactory.  Aortobifemoral stent graft placement.  Arterial calcification.  IMPRESSION: Subcapital femoral neck fracture on the right.   Electronically Signed   By: Franchot Gallo M.D.   On: 06/10/2013 15:16   Ct Head Wo Contrast  06/10/2013   CLINICAL DATA:  Headache and neck pain  EXAM: CT HEAD WITHOUT CONTRAST  CT CERVICAL SPINE WITHOUT CONTRAST  TECHNIQUE: Multidetector CT imaging of the head and cervical spine was performed following the standard protocol without intravenous contrast. Multiplanar CT image reconstructions of the cervical spine were also generated.  COMPARISON:  None.  FINDINGS: CT HEAD FINDINGS  Advanced brain atrophy with associated ventricular enlargement. No acute intracranial hemorrhage, definite infarction, mass lesion, midline shift, herniation, or extra-axial fluid collection. No focal mass effect or edema. Normal gray-white matter differentiation. Cisterns patent. Cerebellar atrophy as well. Atherosclerosis of the intracranial vessels. Mastoids and sinuses are clear. No skull abnormality.  CT CERVICAL SPINE FINDINGS  Advanced cervical spondylosis and degenerative change at all levels. C5-6 solid bony fusion noted. No acute fracture, malalignment, subluxation, or dislocation. Diffuse facet arthropathy as well. Facets remain aligned. Normal prevertebral soft tissues. Carotid calcifications noted. No soft tissue asymmetry in the neck. Clear lung apices.  IMPRESSION: Advanced brain  atrophy.  No acute intracranial finding  Advanced cervical degenerative changes and spondylosis as described. No acute cervical spine fracture or abnormality by CT.   Electronically Signed   By: Daryll Brod M.D.   On: 06/10/2013 15:41   Ct Cervical Spine Wo Contrast  06/10/2013   CLINICAL DATA:  Headache and neck pain  EXAM: CT HEAD WITHOUT CONTRAST  CT CERVICAL SPINE WITHOUT CONTRAST  TECHNIQUE: Multidetector CT imaging of the head and cervical spine was performed following the standard protocol without intravenous contrast. Multiplanar CT image reconstructions of the cervical spine were also generated.  COMPARISON:  None.  FINDINGS: CT HEAD FINDINGS  Advanced brain atrophy with associated ventricular enlargement. No acute intracranial hemorrhage, definite infarction, mass lesion, midline shift, herniation, or extra-axial fluid collection. No focal mass effect or edema. Normal gray-white matter differentiation. Cisterns patent. Cerebellar atrophy as well. Atherosclerosis of the intracranial vessels. Mastoids and sinuses are clear. No skull abnormality.  CT CERVICAL SPINE FINDINGS  Advanced cervical spondylosis and degenerative change at all levels. C5-6 solid bony fusion noted. No acute fracture, malalignment, subluxation, or dislocation. Diffuse facet arthropathy as well. Facets remain aligned. Normal prevertebral soft tissues. Carotid calcifications noted. No soft tissue asymmetry in the neck. Clear lung apices.  IMPRESSION: Advanced brain atrophy.  No acute intracranial finding  Advanced cervical degenerative changes and spondylosis as described. No acute cervical spine fracture or abnormality by CT.   Electronically Signed   By: Daryll Brod M.D.   On: 06/10/2013 15:41    Scheduled Meds: . acetaminophen  650 mg Oral Once  . allopurinol  100 mg Oral 2 times per day  . amLODipine  5 mg Oral Daily  . aspirin EC  81 mg Oral QHS  . atorvastatin  20 mg Oral Daily  . FLUoxetine  20 mg Oral 2 times per  day  . lisinopril  20 mg Oral Daily  . metoprolol tartrate  12.5 mg Oral BID  .  morphine injection  4 mg Intravenous Once  . multivitamin-lutein  1 capsule Oral QHS  . sodium chloride  3 mL Intravenous Q12H   Continuous Infusions: . sodium chloride    . sodium chloride 125 mL/hr at 06/10/13 1653   Antibiotics Given (last 72 hours)   None      Principal Problem:   Hip fracture Active Problems:   S/P CABG (coronary artery bypass graft)   H/O aortic valve replacement   Essential hypertension   Hyperlipidemia   Dementia with Lewy bodies    Time spent: 20 minutes    Annita Brod  [facility] Pager [phone] If 7PM-7AM, please contact night-coverage at [email or web address], password [facility] 06/12/2013, 2:36 PM  LOS: 2 days

## 2013-06-12 NOTE — Transfer of Care (Signed)
Immediate Anesthesia Transfer of Care Note  Patient: Robert Mueller.  Procedure(s) Performed: Procedure(s): ARTHROPLASTY BIPOLAR HIP (Right)  Patient Location: PACU  Anesthesia Type:General  Level of Consciousness: awake and alert   Airway & Oxygen Therapy: Patient Spontanous Breathing and Patient connected to nasal cannula oxygen  Post-op Assessment: Report given to PACU RN and Post -op Vital signs reviewed and stable  Post vital signs: Reviewed and stable  Complications: No apparent anesthesia complications

## 2013-06-12 NOTE — Anesthesia Procedure Notes (Addendum)
Date/Time: 06/12/2013 7:01 PM Performed by: Eligha Bridegroom Pre-anesthesia Checklist: Emergency Drugs available, Patient identified, Timeout performed, Suction available and Patient being monitored Patient Re-evaluated:Patient Re-evaluated prior to inductionOxygen Delivery Method: Circle system utilized Preoxygenation: Pre-oxygenation with 100% oxygen Intubation Type: IV induction Ventilation: Mask ventilation without difficulty Laryngoscope Size: Mac and 4 Grade View: Grade II Tube size: 7.5 mm Number of attempts: 1 Airway Equipment and Method: Stylet Secured at: 22 cm Tube secured with: Tape Dental Injury: Teeth and Oropharynx as per pre-operative assessment    Date/Time: 06/12/2013 7:01 PM Performed by: Eligha Bridegroom Number of attempts: 1 Placement Confirmation: ETT inserted through vocal cords under direct vision,  breath sounds checked- equal and bilateral and positive ETCO2 Secured at: 22 cm

## 2013-06-12 NOTE — Progress Notes (Signed)
Patients daughter Mervin Kung which is also the patients POA called at 6:07 am expressing concerns that she wants to be on site when her father is transported to the OR for surgery. She expressed that she dose not want her father to go into surgery if she is not present. She relayed that she has an appointment at 11 am and that she would like a ballpark figure of when his procedure will take place. RN informed the daughter that the patient is not currently on the OR list and that a call had been made earlier in the night to inquire about when the procedure will take place and the OR was unable to give a definite time due to the fact that the patient is on the add-on list. Please keep the daughter informed of any changes or updates with her fathers procedure scheduled for 06/12/2013.

## 2013-06-13 DIAGNOSIS — D62 Acute posthemorrhagic anemia: Secondary | ICD-10-CM

## 2013-06-13 LAB — CBC
HCT: 26 % — ABNORMAL LOW (ref 39.0–52.0)
Hemoglobin: 8.6 g/dL — ABNORMAL LOW (ref 13.0–17.0)
MCH: 26.7 pg (ref 26.0–34.0)
MCHC: 33.1 g/dL (ref 30.0–36.0)
MCV: 80.7 fL (ref 78.0–100.0)
PLATELETS: 204 10*3/uL (ref 150–400)
RBC: 3.22 MIL/uL — ABNORMAL LOW (ref 4.22–5.81)
RDW: 15.6 % — ABNORMAL HIGH (ref 11.5–15.5)
WBC: 2.4 10*3/uL — ABNORMAL LOW (ref 4.0–10.5)

## 2013-06-13 LAB — BASIC METABOLIC PANEL
BUN: 16 mg/dL (ref 6–23)
CO2: 18 mEq/L — ABNORMAL LOW (ref 19–32)
Calcium: 8.6 mg/dL (ref 8.4–10.5)
Chloride: 103 mEq/L (ref 96–112)
Creatinine, Ser: 1.23 mg/dL (ref 0.50–1.35)
GFR calc Af Amer: 63 mL/min — ABNORMAL LOW (ref >90)
GFR, EST NON AFRICAN AMERICAN: 54 mL/min — AB (ref >90)
Glucose, Bld: 90 mg/dL (ref 70–99)
POTASSIUM: 4.5 meq/L (ref 3.7–5.3)
SODIUM: 138 meq/L (ref 137–147)

## 2013-06-13 MED ORDER — ENSURE COMPLETE PO LIQD: 237.0000 mL | Freq: Two times a day (BID) | ORAL | Status: AC

## 2013-06-13 MED ORDER — POLYETHYLENE GLYCOL 3350 17 G PO PACK
17.0000 g | PACK | Freq: Every day | ORAL | Status: AC | PRN
Start: 2013-06-13 — End: ?

## 2013-06-13 MED ORDER — FERROUS SULFATE 325 (65 FE) MG PO TABS: 325.0000 mg | ORAL_TABLET | Freq: Every day | ORAL | Status: AC

## 2013-06-13 NOTE — Progress Notes (Signed)
Patient had not voided since the last I&O cath at 6am. Bladder scan performed at 15:00, only showing 40cc. IVF running and PO intake is being encouraged. Dr. [place] Pink was sent a text message, with no new orders. Will continue to monitor.

## 2013-06-13 NOTE — Evaluation (Signed)
Physical Therapy Evaluation Patient Details Name: Robert Mueller. MRN: [id number] DOB: 06-Feb-1932 Today's Date: 06/13/2013   History of Present Illness  Pt. presented followwing a fall with R femoral neck fx, now s/p R hip hemiarthroplasty.    Clinical Impression  Pt.presents to PT with dependencies in functional mobility and gait following his hemiarthroplasty, and some confusion at times.  He will need acute PT to initiate functional mobility and gait training as his pain allows, and in preparation for SNF level therapies following acute hospitalization.      Follow Up Recommendations SNF;Supervision/Assistance - 24 hour    Equipment Recommendations  None recommended by PT (pt. reports he has a RW)    Recommendations for Other Services       Precautions / Restrictions Precautions Precautions: Posterior Hip Precaution Booklet Issued: Yes (comment) Precaution Comments: pt somewhat confused and could not receive teaching on hip precautions.  I posted a posterior hip precaution sheet on his dry erase board  Restrictions Weight Bearing Restrictions: Yes RLE Weight Bearing: Weight bearing as tolerated      Mobility  Bed Mobility Overal bed mobility: Needs Assistance;+2 for physical assistance Bed Mobility: Supine to Sit;Sit to Supine     Supine to sit: +2 for physical assistance;Max assist Sit to supine: +2 for physical assistance;Max assist   General bed mobility comments: pt. was unable to assist or follow directions to attempt to assist self.  He moaned in pain from R hip with even minimal movements.  discussed with RN.  Wound dressing appears intact and no obvious abnormalities noted. Heavy use of bed pad and +2 assist to move pt. to sitting position at edge of bed .  also needed assist for LEs and at trunk.  Pt. sat at edge of bed for about 3 minutes before being assisted back to supine  Transfers                    Ambulation/Gait                 Stairs            Wheelchair Mobility    Modified Rankin (Stroke Patients Only)       Balance Overall balance assessment: Needs assistance Sitting-balance support: Bilateral upper extremity supported;Feet supported Sitting balance-Leahy Scale: Poor Sitting balance - Comments: Pt. with heavy reliance of UE support as well as external support from therapist to maintain sitting at edge of bed.  He could not sit fully erect and had tendancy to lean toward L side and away form painful R hip Postural control: Left lateral lean                                   Pertinent Vitals/Pain See vitals tab Pt. Indicates pain in right hip with even minimal movements toward EOB.  RN reports he had morphine recently and is not due more pain med at this time    [place] expects to be discharged to:: Private residence   Available Help at Discharge: Available PRN/intermittently Type of Home: House Home Access: Stairs to enter Entrance Stairs-Rails: Right;Left;Can reach both Entrance Stairs-Number of Steps: 2 Home Layout: One [place]: [place] - 2 wheels;Cane - quad      Prior Function Level of Independence: Independent with assistive device(s)         Comments: drives     Hand Dominance  Extremity/Trunk Assessment   Upper Extremity Assessment: Generalized weakness           Lower Extremity Assessment: Difficult to assess due to impaired cognition         Communication   Communication: No difficulties  Cognition Arousal/Alertness: Awake/alert Behavior During Therapy: WFL for tasks assessed/performed Overall Cognitive Status: No family/caregiver present to determine baseline cognitive functioning Area of Impairment: Orientation;Awareness;Safety/judgement;Problem solving;Memory;Following commands Orientation Level: Place;Time;Situation   Memory: Decreased recall of precautions Following Commands: Follows one step  commands inconsistently Safety/Judgement: Decreased awareness of safety;Decreased awareness of deficits Awareness: Emergent Problem Solving: Slow processing;Decreased initiation;Difficulty sequencing General Comments: pt. presented to this therapist with his hands in the air, calling out for help but unable to determine who he was trying to ask for help.  At times he responded appropriately and at other times he seemed confused.  RN reports he had recently had morphine    General Comments      Exercises Total Joint Exercises Ankle Circles/Pumps: AAROM;Both;10 reps;Supine      Assessment/Plan    PT Assessment Patient needs continued PT services  PT Diagnosis Difficulty walking;Acute pain   PT Problem List Decreased activity tolerance;Decreased balance;Decreased mobility;Decreased knowledge of use of DME;Decreased safety awareness;Decreased knowledge of precautions;Pain;Decreased cognition  PT Treatment Interventions DME instruction;Gait training;Functional mobility training;Therapeutic activities;Therapeutic exercise;Balance training;Patient/family education   PT Goals (Current goals can be found in the Care Plan section) Acute Rehab PT Goals Patient Stated Goal: pt. did not state, agrees with increased mobility and decreased pain PT Goal Formulation: Patient unable to participate in goal setting Time For Goal Achievement: 06/20/13 Potential to Achieve Goals: Fair    Frequency Min 6X/week   Barriers to discharge Decreased caregiver support;Other (comment) (current need for +2 assist )      Co-evaluation               End of Session   Activity Tolerance: Patient limited by pain Patient left: in bed;with call bell/phone within reach;with bed alarm set Nurse Communication: Mobility status;Other (comment) (asked RN to see if he could have more pain med)         Time: 1029-1050 PT Time Calculation (min): 21 min   Charges:   PT Evaluation $Initial PT Evaluation Tier  I: 1 Procedure PT Treatments $Therapeutic Activity: 8-22 mins   PT G CodesLadona Mueller 06/13/2013, 5:38 PM Robert Mueller PT Acute Rehab Services [phone] [place] [phone]

## 2013-06-13 NOTE — Clinical Social Work Psychosocial (Signed)
Clinical Social Work Department  BRIEF PSYCHOSOCIAL ASSESSMENT  Patient: Robert Mueller.  Account Number: [id number]   Admit date: 06/10/13 Clinical Social Worker Rhea Pink, MSW Date/Time: 06/13/2013 1:00 PM Referred by: Physician Date Referred: 06/13/2013 Referred for   SNF Placement   Other Referral:  Interview type: Patient has dementia and cannot make healthcare decisions. CSW contacted patient's daughter and spoke with her over the phone.  Other interview type: PSYCHOSOCIAL DATA  Living Status: Family Admitted from facility:  Level of care:  Primary support name: Mervin Kung Primary support relationship to patient: Daughter Degree of support available:  Strong and vested  CURRENT CONCERNS  Current Concerns   Post-Acute Placement   Other Concerns:  SOCIAL WORK ASSESSMENT / PLAN  Patient has dementia and is unable to make decisions for himself. Patient's daughter, Patrici Ranks, makes all of her father's heathcare decisions. CSW spoke with patient's daughter over the phone. CSW offered support and discussed SNF placement. Patient's daughter reported that the patient has been to [facility] in the past and she was unhappy with the facility and the care that her father received. Patient's daughter is agreeable to placement for her father but does not want him to retrun to [facility]. Patient's daughter has requested time to visit the facilities prior to her father being discharged. CSW will email the facilities bed offers to the patient's daughter so she has time this weekend to visit and make a decision. Patient's daughter was appreciative of CSW support and help. CSW will continue to follow.          CSW completed FL2 and initiated SNF search.     Assessment/plan status: Information/Referral to [employer]  Other assessment/ plan:  Information/referral to community resources:  SNF   PTAR  PATIENT'S/FAMILY'S RESPONSE TO PLAN OF CARE:  Pt's daughter   reported she is agreeable to her father going to  [facility] in order to increase strength and independence with mobility prior to returning home  Pt's daughter verbalized understanding of placement process and appreciation for CSW assist.   Rhea Pink, MSW, [place]

## 2013-06-13 NOTE — Op Note (Signed)
[name], [name] NO.:  [id number]  MEDICAL RECORD NO.:  [id number]  LOCATION:  [id number]                        FACILITY:  [facility]  PHYSICIAN:  Robert Cassis. Alvan Mueller, M.D.  DATE OF BIRTH:  1931/12/09  DATE OF PROCEDURE:  06/12/2013 DATE OF DISCHARGE:                              OPERATIVE REPORT   PREOPERATIVE DIAGNOSIS:  Right femoral neck fracture.  POSTOPERATIVE DIAGNOSIS:  Right femoral neck fracture.  PROCEDURE:  Right hip hemiarthroplasty.  COMPONENTS USED:  DePuy Summit Basic press-fit stem size 7 standard with a 54 unipolar ball and a +5 adapter.  SURGEON:  Robert Cassis. Alvan Mueller, M.D.  ASSISTANT:  Danae Orleans, PA-C.  Note that [name] was present for the entirety of the case from preoperative position, perioperative management of operative extremity, general facilitation of the case, and primary wound closure.  ANESTHESIA:  General.  SPECIMENS:  None.  COMPLICATION:  None.  DRAINS:  None.  BLOOD LOSS:  Less than 236mL.  INDICATION OF THE PROCEDURE:  [name] is a pleasant 80 year old male with some dementia who currently resides with his daughter here in town.  He has had some recent issues with dementia and memory loss.  He unfortunately had a couple recent falls over the past week prior to admission with increasing pain and inability to get around.  Based on the recurrence of falls with increasing pain and dysfunction, they brought him to the emergency room.  Radiographs revealed a relatively nondisplaced femoral neck fracture in the subcapital region.  The patient was admitted to the medical service.  Orthopedics was consulted for management.  Had a lengthy discussion with his daughter, who is power of attorney, in his presence about the options of nonsurgical management, surgical management including percutaneous cannulated screw fixation versus hemiarthroplasty.  Given the risks of nonunion avascular necrosis, need for future  surgery, in addition to standard risk of infection and DVT, she opted to have a hip hemiarthroplasty performed with more definitive measures.  I specifically discussed the risks of infection, DVT, also a dislocation with a hemiarthroplasty.  After this discussion, consent was obtained for management of hip fracture, pain, functional improvement.  PROCEDURE IN DETAIL:  The patient was brought to the operative theater. Once adequate anesthesia, preoperative antibiotics, 900 mg of clindamycin, administered, he was positioned into the left lateral decubitus position with the right hip up.  The right lower extremity was prepped and draped in sterile fashion.  A time-out was performed identifying the patient, planned procedure, and extremity.  An incision was made over the proximal aspect of the trochanter for a posterior approach to the hip.  The iliotibial band and gluteal fascia were then incised and split posteriorly.  Posterior aspect of the hip was exposed.  Large fracture hematoma evacuated.  The hip fracture was readily identified.  I first used the oscillating saw to make a neck osteotomy into the trochanteric fossa.  The femoral head was removed and then measured on the back table using the sizing ring, a 54 mm diameter.  Given these findings, I then attended to the femoral preparation.  Femur was exposed, retractors placed.  Proximal femur was opened with a starting drill, hand reamed  once and irrigated to try to prevent fat emboli.  I then began broaching with a size 1 broach and broached up to a size 7 with good medial and lateral metaphyseal proximal fit.  A trial reduction was now carried out with 7 Summit Basic trial broach with a standard neck.  The hip seemed relatively stable at this point, through an arc of motion without evidence of impingement or subluxation.  I did decide based on his dementia to increase the neck size to +5 to further enhance stability with  lateralization.  He was noted to have a slightly shallow acetabulum.  I did not think this would pose a problem for him given his activity level.  At this point, based on the trial reduction, the trial components were removed.  The final components were opened.  The final size 7 Summit basic standard stem was then impacted with the collar resting on the prepared calcar cut.  The final +5 adapter was then impacted into the 54 unipolar ball, the 2 were then impacted on the clean and dry trunnion and the hip reduced. The hip was irrigated throughout the case and again at this point, the posterior capsule was then reapproximated to each other using #1 Vicryl. The remainder of the wound was closed with #1 Vicryl and the iliotibial band as well as the gluteal fascia.  The remaining wound was closed with 2-0 Vicryl and running 4-0 Monocryl.  The hip was cleaned, dried, and dressed sterilely using Dermabond and Aquacel dressing.  He was then extubated and brought to recovery room in stable condition tolerating the procedure well.  Findings were reviewed with his daughter.  We will allow him to be weightbearing as tolerated based on structural limitation and activity level.  See him back in the office for routine followup.  DVT prophylaxis, weightbearing status, pain medications are prescribed at discharge.     Robert Mueller, M.D.     MDO/MEDQ  D:  06/13/2013  T:  06/13/2013  Job:  [id number]

## 2013-06-13 NOTE — Discharge Summary (Addendum)
Physician Discharge Summary  [name]. JOA:[id number] DOB: 1931/07/16 DOA: 06/10/2013  PCP: No PCP Per Patient  Admit date: 06/10/2013 Anticipated Discharge date: 06/14/2013  Time spent: 25 minutes  Recommendations for Outpatient Follow-up:  1. For SNF for  2. New medications: Iron 325 mg daily x 1 month 3. New medications: OxyIR 5 mg 1-2 pills every 6 hrs as needed for pain 4. New medications: Miralax 17 gm daily as needed for constipation 5. Medication change: Patient's Prozac was substituted with Paxil given Prozac interaction decreasing efficacy of Plavix.  Discharge Diagnoses:  Principal Problem:   Hip fracture:  Active Problems:   S/P CABG (coronary artery bypass graft)   H/O aortic valve replacement   Essential hypertension   Hyperlipidemia   Dementia with Lewy bodies   Acute blood loss anemia   Discharge Condition: Improved, for SNF  Diet recommendation: Heart healthy  Filed Weights   06/10/13 2300  Weight: 78.1 kg (172 lb 2.9 oz)    History of present illness:  Patient with a hx of prior cva, CAD s/p CABG, HTN, AAA, htn, chf who presents to the hospital s/p mechanical fall resulting in hip pain that occurred on 4/3. Pt noted pain in the R hip but did not go to hospital. Instead noted worsening pain and gait, resulting in ED visit on 4/7. In the Ed, CT head was unremarkable. Imaging of the hips however, demonstrated a R subcapital femoral neck fracture. Orthopedic surgery was consulted   Hospital Course:  Principal Problem:   Hip fracture: Following clearance by cardiology, patient underwent right hip hemiarthroplasty on 4/9. Active Problems:   S/P CABG (coronary artery bypass graft): In review from cardiology note of August 2014, patient underwent stress test noting low risk with inferior scar without ischemia.  History of AAA repair: Done at [facility] with no reported complications. At that time patient had cardiac clearance for much more invasive procedure   H/O  aortic valve replacement   Essential hypertension: Stable. Continue on home meds.    Hyperlipidemia: Stable continue on statin    Dementia with Lewy bodies: Stable. Patient is appropriate and interactive.    Acute blood loss anemia: Stable. Started on by mouth iron supplementation for the next month. Hemoglobin at 8.6 on 4/10.  Procedures:  Right hip hemiarthroplasty done 4/9  Consultations:  [facility]-Orthopedic surgery  Discharge Exam: Filed Vitals:   06/13/13 1130  BP:   Pulse:   Temp:   Resp: 16    General: Alert and oriented x2, distress secondary to right hip pain Cardiovascular: Regular rate and rhythm, S0-F0, 2/6 systolic ejection murmur Respiratory: Clear to auscultation bilaterally  Discharge Instructions You were cared for by a hospitalist during your hospital stay. If you have any questions about your discharge medications or the care you received while you were in the hospital after you are discharged, you can call the unit and asked to speak with the hospitalist on call if the hospitalist that took care of you is not available. Once you are discharged, your primary care physician will handle any further medical issues. Please note that NO REFILLS for any discharge medications will be authorized once you are discharged, as it is imperative that you return to your primary care physician (or establish a relationship with a primary care physician if you do not have one) for your aftercare needs so that they can reassess your need for medications and monitor your lab values.  Discharge Orders   Future Orders Complete By Expires  Change dressing  As directed    Weight bearing as tolerated  As directed    Questions:     Laterality:  right   Extremity:  Lower       Medication List         allopurinol 100 MG tablet  Commonly known as:  ZYLOPRIM  Take 100 mg by mouth 2 (two) times daily. 8am and 1pm     amLODipine 5 MG tablet  Commonly known as:  NORVASC  Take 5 mg  by mouth daily.     aspirin EC 81 MG tablet  Take 81 mg by mouth at bedtime.     atorvastatin 20 MG tablet  Commonly known as:  LIPITOR  Take 20 mg by mouth daily.     clopidogrel 75 MG tablet  Commonly known as:  PLAVIX  Take 75 mg by mouth daily.     feeding supplement (ENSURE COMPLETE) Liqd  Take 237 mLs by mouth 2 (two) times daily between meals.     ferrous sulfate 325 (65 FE) MG tablet  Take 1 tablet (325 mg total) by mouth daily with breakfast.     FLUoxetine 20 MG capsule  Commonly known as:  PROZAC  Take 20 mg by mouth 2 (two) times daily. 8am and 1pm     lisinopril 20 MG tablet  Commonly known as:  PRINIVIL,ZESTRIL  Take 20 mg by mouth daily.     megestrol 40 MG/ML suspension  Commonly known as:  MEGACE  Take 200 mg by mouth daily.     Melatonin 3 MG Tabs  Take 9 mg by mouth at bedtime.     metoprolol tartrate 25 MG tablet  Commonly known as:  LOPRESSOR  Take 12.5 mg by mouth 2 (two) times daily. 8am and 1pm     multivitamin-lutein Caps capsule  Take 1 capsule by mouth at bedtime.     oxyCODONE 5 MG immediate release tablet  Commonly known as:  Oxy IR/ROXICODONE  Take 1-2 tablets (5-10 mg total) by mouth 2 (two) times daily as needed for severe pain.     polyethylene glycol packet  Commonly known as:  MIRALAX / GLYCOLAX  Take 17 g by mouth daily as needed.     predniSONE 50 MG tablet  Commonly known as:  DELTASONE  Take 50 mg by mouth See admin instructions. Take prior to contrast dye administration:  1 tablet (50 mg) 13 hours before procedure, 1 tablet 7 hours before and 1 tablet 1 hour before       Allergies  Allergen Reactions  . Amoxicillin Other (See Comments)    Joint swelling  . Metrizamide Hives    Hives in 1955 during ivp, OK W/ 13 HR PREP ON 10-15-12//A.CALHOUN  . Penicillins Other (See Comments)    Joint swelling  . Contrast Media [Iodinated Diagnostic Agents] Hives    Hives in 1955 during ivp, OK W/ 13 HR PREP ON 10-15-12//A.CALHOUN        Follow-up Information   Follow up with Mauri Pole, MD. Schedule an appointment as soon as possible for a visit in 2 weeks.   Specialty:  Orthopedic Surgery   Contact information:   [address] [place] [place] [address] [phone]        The results of significant diagnostics from this hospitalization (including imaging, microbiology, ancillary and laboratory) are listed below for reference.    Significant Diagnostic Studies: Dg Chest 1 View  06/10/2013   CLINICAL DATA:  Fall.  Right hip fracture  EXAM: CHEST - 1 VIEW  COMPARISON:  02/24/2013  FINDINGS: Prior cardiac surgery. Aortic stent graft. Mild cardiac enlargement without heart failure. Lungs are clear  IMPRESSION: No active disease.   Electronically Signed   By: Franchot Gallo M.D.   On: 06/10/2013 15:24   Dg Hip Complete Right  06/10/2013   CLINICAL DATA:  Fall  EXAM: RIGHT HIP - COMPLETE 2+ VIEW  COMPARISON:  None.  FINDINGS: Subcapital femoral neck fracture with mild impaction and minimal displacement. Right hip joint appears satisfactory.  Aortobifemoral stent graft placement.  Arterial calcification.  IMPRESSION: Subcapital femoral neck fracture on the right.   Electronically Signed   By: Franchot Gallo M.D.   On: 06/10/2013 15:16   Ct Head Wo Contrast  06/10/2013   CLINICAL DATA:  Headache and neck pain  EXAM: CT HEAD WITHOUT CONTRAST  CT CERVICAL SPINE WITHOUT CONTRAST  TECHNIQUE: Multidetector CT imaging of the head and cervical spine was performed following the standard protocol without intravenous contrast. Multiplanar CT image reconstructions of the cervical spine were also generated.  COMPARISON:  None.  FINDINGS: CT HEAD FINDINGS  Advanced brain atrophy with associated ventricular enlargement. No acute intracranial hemorrhage, definite infarction, mass lesion, midline shift, herniation, or extra-axial fluid collection. No focal mass effect or edema. Normal gray-white matter differentiation.  Cisterns patent. Cerebellar atrophy as well. Atherosclerosis of the intracranial vessels. Mastoids and sinuses are clear. No skull abnormality.  CT CERVICAL SPINE FINDINGS  Advanced cervical spondylosis and degenerative change at all levels. C5-6 solid bony fusion noted. No acute fracture, malalignment, subluxation, or dislocation. Diffuse facet arthropathy as well. Facets remain aligned. Normal prevertebral soft tissues. Carotid calcifications noted. No soft tissue asymmetry in the neck. Clear lung apices.  IMPRESSION: Advanced brain atrophy.  No acute intracranial finding  Advanced cervical degenerative changes and spondylosis as described. No acute cervical spine fracture or abnormality by CT.   Electronically Signed   By: Daryll Brod M.D.   On: 06/10/2013 15:41   Ct Cervical Spine Wo Contrast  06/10/2013   CLINICAL DATA:  Headache and neck pain  EXAM: CT HEAD WITHOUT CONTRAST  CT CERVICAL SPINE WITHOUT CONTRAST  TECHNIQUE: Multidetector CT imaging of the head and cervical spine was performed following the standard protocol without intravenous contrast. Multiplanar CT image reconstructions of the cervical spine were also generated.  COMPARISON:  None.  FINDINGS: CT HEAD FINDINGS  Advanced brain atrophy with associated ventricular enlargement. No acute intracranial hemorrhage, definite infarction, mass lesion, midline shift, herniation, or extra-axial fluid collection. No focal mass effect or edema. Normal gray-white matter differentiation. Cisterns patent. Cerebellar atrophy as well. Atherosclerosis of the intracranial vessels. Mastoids and sinuses are clear. No skull abnormality.  CT CERVICAL SPINE FINDINGS  Advanced cervical spondylosis and degenerative change at all levels. C5-6 solid bony fusion noted. No acute fracture, malalignment, subluxation, or dislocation. Diffuse facet arthropathy as well. Facets remain aligned. Normal prevertebral soft tissues. Carotid calcifications noted. No soft tissue  asymmetry in the neck. Clear lung apices.  IMPRESSION: Advanced brain atrophy.  No acute intracranial finding  Advanced cervical degenerative changes and spondylosis as described. No acute cervical spine fracture or abnormality by CT.   Electronically Signed   By: Daryll Brod M.D.   On: 06/10/2013 15:41   Dg Pelvis Portable  06/12/2013   CLINICAL DATA:  Postop change  EXAM: PORTABLE PELVIS 1-2 VIEWS  COMPARISON:  None.  FINDINGS: Changes of recent right hip replacement are noted. No acute  abnormality is seen. There are changes consistent with aortic stent graft placement.   Electronically Signed   By: Inez Catalina M.D.   On: 06/12/2013 20:48    Microbiology: Recent Results (from the past 240 hour(s))  MRSA PCR SCREENING     Status: None   Collection Time    06/11/13  6:31 PM      Result Value Ref Range Status   MRSA by PCR NEGATIVE  NEGATIVE Final   Comment:            The GeneXpert MRSA Assay (FDA     approved for NASAL specimens     only), is one component of a     comprehensive MRSA colonization     surveillance program. It is not     intended to diagnose MRSA     infection nor to guide or     monitor treatment for     MRSA infections.     Labs: Basic Metabolic Panel:  Recent Labs Lab 06/10/13 1440 06/10/13 1745 06/11/13 0540 06/13/13 0735  NA 139  --  140 138  K 4.0  --  3.7 4.5  CL 104  --  107 103  CO2 17*  --  19 18*  GLUCOSE 89  --  81 90  BUN 23  --  22 16  CREATININE 0.88 0.76 0.81 1.23  CALCIUM 8.9  --  8.3* 8.6  MG  --   --  1.9  --    Liver Function Tests:  Recent Labs Lab 06/11/13 0540  AST 13  ALT 7  ALKPHOS 88  BILITOT 0.5  PROT 5.7*  ALBUMIN 2.7*   No results found for this basename: LIPASE, AMYLASE,  in the last 168 hours No results found for this basename: AMMONIA,  in the last 168 hours CBC:  Recent Labs Lab 06/10/13 1440 06/10/13 1745 06/11/13 0540 06/13/13 0735  WBC 2.5* 2.3* 2.1* 2.4*  NEUTROABS 0.8*  --   --   --   HGB 11.1*  10.3* 9.7* 8.6*  HCT 32.9* 30.8* 29.2* 26.0*  MCV 80.8 80.6 81.3 80.7  PLT 167 165 163 204   Cardiac Enzymes:  Recent Labs Lab 06/10/13 1440  TROPONINI <0.30   BNP: BNP (last 3 results) No results found for this basename: PROBNP,  in the last 8760 hours CBG: No results found for this basename: GLUCAP,  in the last 168 hours     Signed:  Annita Brod  [facility] 06/13/2013, 12:07 PM

## 2013-06-13 NOTE — Progress Notes (Addendum)
   Subjective: 1 Day Post-Op Procedure(s) (LRB): ARTHROPLASTY BIPOLAR HIP (Right)   Patient reports pain as mild, pain controlled. No events throughout the night. We have discussed the surgery and the aftercare.   Objective:   VITALS:   Filed Vitals:   06/13/13 0052  BP: 123/66  Pulse: 77  Temp: 98.5 F (36.9 C)  Resp: 16    Neurovascular intact Dorsiflexion/Plantar flexion intact Incision: dressing C/Mueller/I No cellulitis present Compartment soft  LABS  Recent Labs  06/10/13 1440 06/10/13 1745 06/11/13 0540  HGB 11.1* 10.3* 9.7*  HCT 32.9* 30.8* 29.2*  WBC 2.5* 2.3* 2.1*  PLT 167 165 163     Recent Labs  06/10/13 1440 06/10/13 1745 06/11/13 0540  NA 139  --  140  K 4.0  --  3.7  BUN 23  --  22  CREATININE 0.88 0.76 0.81  GLUCOSE 89  --  81     Assessment/Plan: 1 Day Post-Op Procedure(s) (LRB): ARTHROPLASTY BIPOLAR HIP (Right)  Advance diet Up with therapy  Ortho recommendations: Plavix and ASA post-op, anticoagulation, orders written to start back on 06/13/2013 Oxycodone for pain management (Rx written, on chart). MiraLax and Colace for constipation Iron 325 mg tid for 2-3 weeks  WBAT on the right hip. Dressing to remain in place until follow in clinic in 2 weeks. Dressing is waterproof and may shower with it in place. Follow up in 2 weeks at [facility]. Follow up with OLIN,Robert Mueller in 2 weeks.  Contact information:  [facility] [address] [place] Robert Mueller   PAC  06/13/2013, 7:39 AM

## 2013-06-13 NOTE — Discharge Instructions (Signed)
Hip Hemiarthroplasty The hip joint is located where the upper end of the femur meets the pelvis socket (acetabulum). The femur, or thigh bone, looks like a long stem with a ball on the end. The acetabulum is a socket or cup-like structure in the pelvis, or hip bone. This "ball and socket" allows your hip to move. During Hip hemiarthroplasty, your surgeon removes the diseased bone tissue and cartilage from the hip joint. The healthy parts of the hip are left intact. The head of the femur (the ball) and the socket (acetabulum) is replaced with new, artificial parts. The new hip is made of materials that allow a normal movement of the joint. This surgery usually lasts 2 to 3 hours.  The purpose of this surgery is to reduce pain and improve range of motion. It is one of the most successful joint replacement surgeries. It most often greatly improves the quality of life. LET YOUR CAREGIVERS KNOW ABOUT:  Allergies  Medications taken including herbs, eye drops, over the counter medications, and creams  Use of steroids (by mouth or creams)  Previous problems with anesthetics or Novocaine  Possibility of pregnancy, if this applies  History of blood clots (thrombophlebitis)  History of bleeding or blood problems  Previous surgery  Other health problems BEFORE THE PROCEDURE  Do not eat or drink anything for as long as directed by your caregiver prior to surgery.  You should be present 60 minutes prior to your procedure or as directed. Prior to surgery an IV (intravenous line for giving fluids) may be started. You may be given an anesthetic (medications and gas to help you sleep) during the procedure.  AFTER THE PROCEDURE After surgery, you will be taken to the recovery area. There a nurse will watch and check your progress. You may have a catheter (a long, narrow, hollow tube) in your bladder following surgery. The catheter helps you pass your water. Once you're awake, stable, and taking fluids  well, barring other problems you'll be returned to your room. You will receive physical therapy until you are doing well and your caregiver feels it is safe for you to be transferred home or to an extended care facility. HOME CARE INSTRUCTIONS   You may resume normal diet and activities as directed or allowed.  Change dressings if necessary or as directed.  Only take over-the-counter or prescription medicines for pain, discomfort, or fever as directed by your caregiver. SEEK IMMEDIATE MEDICAL CARE IF: You develop:  Swelling of your calf or leg or develop shortness of breath or chest pain.  Redness, swelling, or increasing pain in the wound.  Pus coming from wound.  An unexplained oral temperature over 102 F (38.9 C) develops.  A foul smell coming from the wound or dressing.  A breaking open of the wound (edges not staying together) after sutures or staples have been removed. MAKE SURE YOU:   Understand these instructions.  Will watch your condition.  Will get help right away if you are not doing well or get worse. Document Released: 03/07/2006 Document Revised: 05/15/2011 Document Reviewed: 04/03/2006 [facility] Patient Information 2014 [place], [place].

## 2013-06-13 NOTE — Progress Notes (Signed)
CSW contacted Patient's daughter to discuss SNF placement. CSW left message. CSW will continue to follow  Rhea Pink, MSW, [place]

## 2013-06-13 NOTE — Clinical Social Work Placement (Addendum)
Clinical Social Work [place] WORK PLACEMENT NOTE  Patient: Amory Simonetti. Account Number: [id number] Admit date: 06/10/13  Clinical Social Worker: Rhea Pink LCSWA Date/time: 06/13/2013 11:30 AM  Clinical Social Work is seeking post-discharge placement for this patient at the following level of care: [place] (*CSW will update this form in [place] as items are completed)  06/13/2013 Patient/family provided with [place] Department of Clinical Social Work's list of facilities offering this level of care within the geographic area requested by the patient (or if unable, by the patient's family).  4/10/2015Patient/family informed of their freedom to choose among providers that offer the needed level of care, that participate in Medicare, Medicaid or managed care program needed by the patient, have an available bed and are willing to accept the patient.  4/10/2015Patient/family informed of [facility]' ownership interest in [facility], as well as of the fact that they are under no obligation to receive care at this facility.  PASARR submitted to EDS on Pre-existing  PASARR number received from EDS on  FL2 transmitted to all facilities in geographic area requested by pt/family on 06/13/2013  FL2 transmitted to all facilities within larger geographic area on  Patient informed that his/her managed care company has contracts with or will negotiate with certain facilities, including the following:  Patient/family informed of bed offers received: 06/16/13 Patient chooses bed at  [place] Physician recommends and patient chooses bed at  Patient to be transferred to on  [facility] Patient to be transferred to facility by EMS The following physician request were entered in Epic:  Additional Comments:

## 2013-06-14 LAB — CBC
HCT: 22.9 % — ABNORMAL LOW (ref 39.0–52.0)
Hemoglobin: 7.8 g/dL — ABNORMAL LOW (ref 13.0–17.0)
MCH: 27.4 pg (ref 26.0–34.0)
MCHC: 34.1 g/dL (ref 30.0–36.0)
MCV: 80.4 fL (ref 78.0–100.0)
Platelets: 190 10*3/uL (ref 150–400)
RBC: 2.85 MIL/uL — ABNORMAL LOW (ref 4.22–5.81)
RDW: 16 % — AB (ref 11.5–15.5)
WBC: 2.9 10*3/uL — ABNORMAL LOW (ref 4.0–10.5)

## 2013-06-14 LAB — BASIC METABOLIC PANEL
BUN: 27 mg/dL — ABNORMAL HIGH (ref 6–23)
CALCIUM: 8.2 mg/dL — AB (ref 8.4–10.5)
CO2: 17 mEq/L — ABNORMAL LOW (ref 19–32)
Chloride: 101 mEq/L (ref 96–112)
Creatinine, Ser: 1.34 mg/dL (ref 0.50–1.35)
GFR calc Af Amer: 56 mL/min — ABNORMAL LOW (ref >90)
GFR, EST NON AFRICAN AMERICAN: 49 mL/min — AB (ref >90)
Glucose, Bld: 129 mg/dL — ABNORMAL HIGH (ref 70–99)
Potassium: 4.4 mEq/L (ref 3.7–5.3)
SODIUM: 136 meq/L — AB (ref 137–147)

## 2013-06-14 LAB — URINALYSIS, ROUTINE W REFLEX MICROSCOPIC
Glucose, UA: NEGATIVE mg/dL
Hgb urine dipstick: NEGATIVE
Ketones, ur: NEGATIVE mg/dL
Leukocytes, UA: NEGATIVE
Nitrite: NEGATIVE
PROTEIN: NEGATIVE mg/dL
Specific Gravity, Urine: 1.024 (ref 1.005–1.030)
UROBILINOGEN UA: 0.2 mg/dL (ref 0.0–1.0)
pH: 5 (ref 5.0–8.0)

## 2013-06-14 LAB — TROPONIN I: Troponin I: <0.3 ng/mL (ref <0.30)

## 2013-06-14 LAB — LACTIC ACID, PLASMA: LACTIC ACID, VENOUS: 2 mmol/L (ref 0.5–2.2)

## 2013-06-14 MED ORDER — PAROXETINE HCL 20 MG PO TABS
20.0000 mg | ORAL_TABLET | Freq: Two times a day (BID) | ORAL | Status: DC
  Administered 2013-06-15 – 2013-06-17 (×6): 20 mg via ORAL
  Filled 2013-06-14 (×8): qty 1

## 2013-06-14 NOTE — Progress Notes (Signed)
OT Cancellation Note  Patient Details Name: Robert Mueller. MRN: [id number] DOB: Jul 20, 1931   Cancelled Treatment:    Reason Eval/Treat Not Completed: OT screened. Pt is Medicare and current D/C plan is SNF. No apparent immediate acute care OT needs, therefore will defer OT to SNF. If OT eval is needed please call Acute Rehab Dept. at [phone] or text page OT at [phone].    Benito Mccreedy OTR/L [phone] 06/14/2013, 3:29 PM

## 2013-06-14 NOTE — Progress Notes (Signed)
Physical Therapy Treatment Patient Details Name: Robert Mueller. MRN: [id number] DOB: 12-14-1931 Today's Date: Jul 01, 2013    History of Present Illness Pt. presented followwing a fall with R femoral neck fx, now s/p R hip hemiarthroplasty.      PT Comments    Pt making slow, steady progress toward goals with increased mobility and less overall assistance needed today.  Follow Up Recommendations  SNF;Supervision/Assistance - 24 hour     Equipment Recommendations  None recommended by PT    Precautions / Restrictions Precautions Precautions: Posterior Hip Precaution Comments: Reeducated pt on 3/3 posterior hip precautions. pt unable to retain education, needs cues throughout session Restrictions RLE Weight Bearing: Weight bearing as tolerated    Mobility  Bed Mobility Overal bed mobility: Needs Assistance       Supine to sit: Min assist;+2 for physical assistance;HOB elevated     General bed mobility comments: HOB elevated to 30-40 degrees and rails used with mod multimodal cues to get to edge of bed. assist needed with right leg management and to complete trunk transition into sitting.  Transfers Overall transfer level: Needs assistance Equipment used: Rolling walker (2 wheeled);2 person hand held assist Transfers: Sit to/from [facility] Sit to Stand: Mod assist;+2 physical assistance;From elevated surface Stand pivot transfers: Mod assist;+2 physical assistance;From elevated surface       General transfer comment: cues on hand placement and right leg placement with sit/stand transfers x2 reps (one with RW and one without). stand/pivot transfer bed to chair without walker with cues on technique due to pt unable to safely move with walker.     Cognition Arousal/Alertness: Awake/alert   Overall Cognitive Status: No family/caregiver present to determine baseline cognitive functioning Area of Impairment: Memory;Safety/judgement;Awareness;Problem  solving;Orientation Orientation Level: Disoriented to;Situation;Time   Memory: Decreased recall of precautions;Decreased short-term memory Following Commands: Follows one step commands consistently;Follows one step commands with increased time;Follows multi-step commands inconsistently Safety/Judgement: Decreased awareness of safety;Decreased awareness of deficits   Problem Solving: Slow processing;Difficulty sequencing;Requires verbal cues;Requires tactile cues          PT Goals (current goals can now be found in the care plan section) Acute Rehab PT Goals Time For Goal Achievement: 06/20/13 Potential to Achieve Goals: Fair Progress towards PT goals: Progressing toward goals    Frequency  Min 6X/week    PT Plan Current plan remains appropriate    End of Session Equipment Utilized During Treatment: Gait belt Activity Tolerance: Patient tolerated treatment well Patient left: in chair;with call bell/phone within reach     Time: 0916-0931 PT Time Calculation (min): 15 min  Charges:  $Therapeutic Activity: 8-22 mins                    G Codes:      Willow Ora 2013/07/01, 12:42 PM

## 2013-06-14 NOTE — Progress Notes (Signed)
Orthopedic Tech Progress Note Patient Details:  Robert Mueller. 1931/08/19 [id number] Hip abduction pillow delivered to room Ortho Devices Type of Ortho Device: Abduction pillow Ortho Device/Splint Location: Hip Ortho Device/Splint Interventions: Ordered   [place] 06/14/2013, 9:49 AM

## 2013-06-14 NOTE — Progress Notes (Signed)
PROGRESS NOTE  Robert Mueller. [id number]:[id number] DOB: [birth year] DOA: 06/10/2013 PCP: No PCP Per Patient  Patient with a hx of prior cva, CAD s/p CABG, HTN, AAA, htn, chf who presents to the hospital s/p mechanical fall resulting in hip pain that occurred on 4/3. Pt noted pain in the R hip but did not go to hospital. Instead noted worsening pain and gait, resulting in ED visit today. In the Ed, CT head was unremarkable. Imaging of the hips however, demonstrated a R subcapital femoral neck fracture. Orthopedic surgery was consulted  Assessment/Plan: Hip fracture  Cleared for OR. Pt is s/p recent AAA repair at [facility] with no reported complications. pt had cardiac clearance for the invasive procedure- will touch base with cardiology here- Dr. Gwenlyn Found from 10/2012 said: Low risk with inferior scar w/o ischemia on stress test Status post right hip hemiarthroplasty on 4/9 and Plavix restarted  Acute blood loss anemia: Hemoglobin down to 7.8. Continue to monitor. On iron supplementation  Fever: Low-grade temperature 100.6 noted. To be reactive. No signs of infection. Urine unremarkable. Continue to monitor and we'll not start any new antibiotics. Patient was noted to have some urinary retention so please Foley catheter  CAD  Asymptomatic EKG unremarkable Currently on plavix - currently holding Cont other home meds  Hx AAA repair  Done about 2 months ago at [facility] Will obtain records  HLD  Cont home statin  HTN  BP stable Cont metoprolol, amlodipine  Dementia -patient is functional- but was not sure where his surgery was- insisted it was [facility]  Metabolic acidosis: Unclear etiology maybe starvation ketosis. Has been going on since admission. We'll still check lactic acid level as well as troponin  ? History of factor V Leiden antibody: As mentioned by daughter. Will try to review old records. Patient may benefit from full anticoagulation after surgery  Code Status: full Family Communication:  Daughter. at bedside Disposition Plan: Likely skilled nursing, likely Monday   Consultants:  ortho  Procedures:  Status post right hip hemiarthroplasty done 4/9    HPI/Subjective: Patient resting comfortably. Some mild soreness. Denies any cough or shortness of breath  Objective: Filed Vitals:   06/14/13 1514  BP: 125/57  Pulse: 78  Temp: 97.7 F (36.5 C)  Resp: 16    Intake/Output Summary (Last 24 hours) at 06/14/13 1624 Last data filed at 06/14/13 0655  Gross per 24 hour  Intake   1750 ml  Output      0 ml  Net   1750 ml   Filed Weights   06/10/13 2300  Weight: 78.1 kg (172 lb 2.9 oz)    Exam:   General:  Resting comfortably  Cardiovascular: Regular rate and rhythm, S1-S2  Respiratory: Clear to auscultation bilaterally  Abdomen: Soft, nontender, nondistended, positive bowel sounds  Musculoskeletal: No clubbing or cyanosis or edema  Data Reviewed: Basic Metabolic Panel:  Recent Labs Lab 06/10/13 1440 06/10/13 1745 06/11/13 0540 06/13/13 0735 06/14/13 0625  NA 139  --  140 138 136*  K 4.0  --  3.7 4.5 4.4  CL 104  --  107 103 101  CO2 17*  --  19 18* 17*  GLUCOSE 89  --  81 90 129*  BUN 23  --  22 16 27*  CREATININE 0.88 0.76 0.81 1.23 1.34  CALCIUM 8.9  --  8.3* 8.6 8.2*  MG  --   --  1.9  --   --    Liver Function Tests:  Recent Labs  Lab 06/11/13 0540  AST 13  ALT 7  ALKPHOS 88  BILITOT 0.5  PROT 5.7*  ALBUMIN 2.7*   No results found for this basename: LIPASE, AMYLASE,  in the last 168 hours No results found for this basename: AMMONIA,  in the last 168 hours CBC:  Recent Labs Lab 06/10/13 1440 06/10/13 1745 06/11/13 0540 06/13/13 0735 06/14/13 0625  WBC 2.5* 2.3* 2.1* 2.4* 2.9*  NEUTROABS 0.8*  --   --   --   --   HGB 11.1* 10.3* 9.7* 8.6* 7.8*  HCT 32.9* 30.8* 29.2* 26.0* 22.9*  MCV 80.8 80.6 81.3 80.7 80.4  PLT 167 165 163 204 190   Cardiac Enzymes:  Recent Labs Lab 06/10/13 1440  TROPONINI <0.30   BNP  (last 3 results) No results found for this basename: PROBNP,  in the last 8760 hours CBG: No results found for this basename: GLUCAP,  in the last 168 hours  Recent Results (from the past 240 hour(s))  MRSA PCR SCREENING     Status: None   Collection Time    06/11/13  6:31 PM      Result Value Ref Range Status   MRSA by PCR NEGATIVE  NEGATIVE Final   Comment:            The GeneXpert MRSA Assay (FDA     approved for NASAL specimens     only), is one component of a     comprehensive MRSA colonization     surveillance program. It is not     intended to diagnose MRSA     infection nor to guide or     monitor treatment for     MRSA infections.     Studies: Dg Pelvis Portable  06/12/2013   CLINICAL DATA:  Postop change  EXAM: PORTABLE PELVIS 1-2 VIEWS  COMPARISON:  None.  FINDINGS: Changes of recent right hip replacement are noted. No acute abnormality is seen. There are changes consistent with aortic stent graft placement.   Electronically Signed   By: Inez Catalina M.D.   On: 06/12/2013 20:48    Scheduled Meds: . allopurinol  100 mg Oral 2 times per day  . amLODipine  5 mg Oral Daily  . aspirin EC  81 mg Oral QHS  . atorvastatin  20 mg Oral Daily  . clopidogrel  75 mg Oral Daily  . docusate sodium  100 mg Oral BID  . feeding supplement (ENSURE COMPLETE)  237 mL Oral BID BM  . ferrous sulfate  325 mg Oral TID PC  . lisinopril  20 mg Oral Daily  . metoprolol tartrate  12.5 mg Oral BID  .  morphine injection  4 mg Intravenous Once  . multivitamin-lutein  1 capsule Oral QHS  . [START ON 06/15/2013] PARoxetine  20 mg Oral BID  . polyethylene glycol  17 g Oral BID  . sodium chloride  3 mL Intravenous Q12H   Continuous Infusions: . sodium chloride    . sodium chloride 75 mL/hr at 06/14/13 1526   Antibiotics Given (last 72 hours)   Date/Time Action Medication Dose Rate   06/12/13 1900 Given   clindamycin (CLEOCIN) IVPB 900 mg 900 mg    06/13/13 0034 Given   clindamycin  (CLEOCIN) IVPB 600 mg 600 mg 100 mL/hr   06/13/13 0518 Given   clindamycin (CLEOCIN) IVPB 600 mg 600 mg 100 mL/hr      Principal Problem:   Hip fracture Active Problems:   S/P CABG (  coronary artery bypass graft)   H/O aortic valve replacement   Essential hypertension   Hyperlipidemia   Dementia with Lewy bodies   Acute blood loss anemia    Time spent: 25 minutes    Annita Brod  [facility] Pager [phone] If 7PM-7AM, please contact night-coverage at [email or web address], password [facility] 06/14/2013, 4:24 PM  LOS: 4 days

## 2013-06-15 LAB — HEPATIC FUNCTION PANEL
ALK PHOS: 135 U/L — AB (ref 39–117)
ALT: 9 U/L (ref 0–53)
AST: 17 U/L (ref 0–37)
Albumin: 2.3 g/dL — ABNORMAL LOW (ref 3.5–5.2)
Total Bilirubin: 0.6 mg/dL (ref 0.3–1.2)
Total Protein: 5.6 g/dL — ABNORMAL LOW (ref 6.0–8.3)

## 2013-06-15 LAB — BASIC METABOLIC PANEL
BUN: 22 mg/dL (ref 6–23)
CALCIUM: 8.5 mg/dL (ref 8.4–10.5)
CO2: 20 mEq/L (ref 19–32)
CREATININE: 0.89 mg/dL (ref 0.50–1.35)
Chloride: 103 mEq/L (ref 96–112)
GFR calc Af Amer: >90 mL/min (ref >90)
GFR calc non Af Amer: 79 mL/min — ABNORMAL LOW (ref >90)
GLUCOSE: 94 mg/dL (ref 70–99)
Potassium: 4.7 mEq/L (ref 3.7–5.3)
Sodium: 136 mEq/L — ABNORMAL LOW (ref 137–147)

## 2013-06-15 LAB — CBC
HEMATOCRIT: 20.8 % — AB (ref 39.0–52.0)
HEMOGLOBIN: 7.1 g/dL — AB (ref 13.0–17.0)
MCH: 26.7 pg (ref 26.0–34.0)
MCHC: 34.1 g/dL (ref 30.0–36.0)
MCV: 78.2 fL (ref 78.0–100.0)
Platelets: 109 10*3/uL — ABNORMAL LOW (ref 150–400)
RBC: 2.66 MIL/uL — ABNORMAL LOW (ref 4.22–5.81)
RDW: 16.1 % — ABNORMAL HIGH (ref 11.5–15.5)
WBC: 2.8 10*3/uL — ABNORMAL LOW (ref 4.0–10.5)

## 2013-06-15 LAB — HEMOGLOBIN AND HEMATOCRIT, BLOOD
HCT: 22.4 % — ABNORMAL LOW (ref 39.0–52.0)
Hemoglobin: 7.6 g/dL — ABNORMAL LOW (ref 13.0–17.0)

## 2013-06-15 MED ORDER — FLEET ENEMA 7-19 GM/118ML RE ENEM
1.0000 | ENEMA | Freq: Once | RECTAL | Status: AC
  Administered 2013-06-15: 1 via RECTAL
  Filled 2013-06-15: qty 1

## 2013-06-15 NOTE — Progress Notes (Signed)
PROGRESS NOTE  Robert Mueller. CZY:[id number] DOB: 07-02-1931 DOA: 06/10/2013 PCP: No PCP Per Patient  Patient with a hx of prior cva, CAD s/p CABG, HTN, AAA, htn, chf who presents to the hospital s/p mechanical fall resulting in hip pain that occurred on 4/3. Pt noted pain in the R hip but did not go to hospital. Instead noted worsening pain and gait, resulting in ED visit today. In the Ed, CT head was unremarkable. Imaging of the hips however, demonstrated a R subcapital femoral neck fracture. Orthopedic surgery was consulted  Assessment/Plan: Hip fracture  Cleared for OR. Pt is s/p recent AAA repair at [facility] with no reported complications. pt had cardiac clearance for the invasive procedure- will touch base with cardiology here- Dr. Gwenlyn Found from 10/2012 said: Low risk with inferior scar w/o ischemia on stress test Status post right hip hemiarthroplasty on 4/9 and Plavix restarted  Acute blood loss anemia: Hemoglobin down to 7.1 this morning. Recheck level this afternoon and if lower, we'll transfuse 1 unit packed red blood cells Continue to monitor. On iron supplementation  Fever: Low-grade temperature 100.6 noted 36 hours ago. To be reactive. No signs of infection. Urine unremarkable. Continue to monitor and we'll not start any new antibiotics. Patient was noted to have some urinary retention so Foley catheter placed. Urinalysis unremarkable other than some mild bilirubinuria. We'll check liver function tests. No further fevers  CAD  Asymptomatic EKG unremarkable Currently on plavix - currently holding Cont other home meds  Hx AAA repair  Done about 2 months ago at [facility] Will obtain records  HLD  Cont home statin  HTN  BP stable Cont metoprolol, amlodipine  Dementia -patient is functional- but was not sure where his surgery was- insisted it was [facility]  Metabolic acidosis: Unclear etiology maybe starvation ketosis. Has been going on since admission, down to 13 today. We'll still check  lactic acid level as well as troponin  ? History of factor V Leiden antibody: As mentioned by daughter. Will try to review old records.   Code Status: full Family Communication: Left message with daughter Disposition Plan: Likely skilled nursing, likely Monday   Consultants:  ortho  Procedures:  Status post right hip hemiarthroplasty done 4/9    HPI/Subjective: No complaints. Breathing comfortably. Pain well controlled.  Objective: Filed Vitals:   06/15/13 0552  BP: 125/64  Pulse: 78  Temp: 98 F (36.7 C)  Resp: 18    Intake/Output Summary (Last 24 hours) at 06/15/13 1454 Last data filed at 06/15/13 0700  Gross per 24 hour  Intake   1620 ml  Output   1050 ml  Net    570 ml   Filed Weights   06/10/13 2300  Weight: 78.1 kg (172 lb 2.9 oz)    Exam:   General:  Alert and oriented x2, no acute distress  Cardiovascular: Regular rate and rhythm, S1-S2  Respiratory: Clear to auscultation bilaterally  Abdomen: Soft, nontender, nondistended, positive bowel sounds  Musculoskeletal: No clubbing or cyanosis or edema  Data Reviewed: Basic Metabolic Panel:  Recent Labs Lab 06/10/13 1440 06/10/13 1745 06/11/13 0540 06/13/13 0735 06/14/13 0625 06/15/13 0500  NA 139  --  140 138 136* 136*  K 4.0  --  3.7 4.5 4.4 4.7  CL 104  --  107 103 101 103  CO2 17*  --  19 18* 17* 20  GLUCOSE 89  --  81 90 129* 94  BUN 23  --  22 16 27* 22  CREATININE 0.88 0.76 0.81 1.23 1.34 0.89  CALCIUM 8.9  --  8.3* 8.6 8.2* 8.5  MG  --   --  1.9  --   --   --    Liver Function Tests:  Recent Labs Lab 06/11/13 0540  AST 13  ALT 7  ALKPHOS 88  BILITOT 0.5  PROT 5.7*  ALBUMIN 2.7*   No results found for this basename: LIPASE, AMYLASE,  in the last 168 hours No results found for this basename: AMMONIA,  in the last 168 hours CBC:  Recent Labs Lab 06/10/13 1440 06/10/13 1745 06/11/13 0540 06/13/13 0735 06/14/13 0625 06/15/13 0500  WBC 2.5* 2.3* 2.1* 2.4* 2.9*  2.8*  NEUTROABS 0.8*  --   --   --   --   --   HGB 11.1* 10.3* 9.7* 8.6* 7.8* 7.1*  HCT 32.9* 30.8* 29.2* 26.0* 22.9* 20.8*  MCV 80.8 80.6 81.3 80.7 80.4 78.2  PLT 167 165 163 204 190 109*   Cardiac Enzymes:  Recent Labs Lab 06/10/13 1440 06/14/13 2138  TROPONINI <0.30 <0.30   BNP (last 3 results) No results found for this basename: PROBNP,  in the last 8760 hours CBG: No results found for this basename: GLUCAP,  in the last 168 hours  Recent Results (from the past 240 hour(s))  MRSA PCR SCREENING     Status: None   Collection Time    06/11/13  6:31 PM      Result Value Ref Range Status   MRSA by PCR NEGATIVE  NEGATIVE Final   Comment:            The GeneXpert MRSA Assay (FDA     approved for NASAL specimens     only), is one component of a     comprehensive MRSA colonization     surveillance program. It is not     intended to diagnose MRSA     infection nor to guide or     monitor treatment for     MRSA infections.     Studies: No results found.  Scheduled Meds: . allopurinol  100 mg Oral 2 times per day  . amLODipine  5 mg Oral Daily  . aspirin EC  81 mg Oral QHS  . atorvastatin  20 mg Oral Daily  . clopidogrel  75 mg Oral Daily  . docusate sodium  100 mg Oral BID  . feeding supplement (ENSURE COMPLETE)  237 mL Oral BID BM  . ferrous sulfate  325 mg Oral TID PC  . lisinopril  20 mg Oral Daily  . metoprolol tartrate  12.5 mg Oral BID  .  morphine injection  4 mg Intravenous Once  . multivitamin-lutein  1 capsule Oral QHS  . PARoxetine  20 mg Oral BID  . polyethylene glycol  17 g Oral BID  . sodium chloride  3 mL Intravenous Q12H  . sodium phosphate  1 enema Rectal Once   Continuous Infusions:   Antibiotics Given (last 72 hours)   Date/Time Action Medication Dose Rate   06/12/13 1900 Given   clindamycin (CLEOCIN) IVPB 900 mg 900 mg    06/13/13 0034 Given   clindamycin (CLEOCIN) IVPB 600 mg 600 mg 100 mL/hr   06/13/13 0518 Given   clindamycin  (CLEOCIN) IVPB 600 mg 600 mg 100 mL/hr      Principal Problem:   Hip fracture Active Problems:   S/P CABG (coronary artery bypass graft)   H/O aortic valve replacement   Essential  hypertension   Hyperlipidemia   Dementia with Lewy bodies   Acute blood loss anemia    Time spent: 25 minutes    Annita Brod  [facility] Pager [phone] If 7PM-7AM, please contact night-coverage at [email or web address], password [facility] 06/15/2013, 2:54 PM  LOS: 5 days

## 2013-06-15 NOTE — Progress Notes (Signed)
Chaplain spoke with pt before being paged to the ed for trauma call.  Pt was alert and responding but seemed the change the conversation topic quite a bit.  Chaplain tried to have pt gear conversation around a particular topic but the pt would change that topic and talk about something else.  Chaplain listened for as long as possible before responding to the other page.  Chaplain asked if pt would like a referral to another chaplain and pt declined.

## 2013-06-16 ENCOUNTER — Encounter (HOSPITAL_COMMUNITY): Payer: Self-pay | Admitting: Orthopedic Surgery

## 2013-06-16 DIAGNOSIS — R338 Other retention of urine: Secondary | ICD-10-CM | POA: Diagnosis not present

## 2013-06-16 LAB — TYPE AND SCREEN
ABO/RH(D): A NEG
Antibody Screen: POSITIVE
DAT, IGG: NEGATIVE
Donor AG Type: NEGATIVE
Donor AG Type: NEGATIVE
PT AG Type: NEGATIVE
Unit division: 0
Unit division: 0

## 2013-06-16 MED ORDER — PAROXETINE HCL 20 MG PO TABS: 20.0000 mg | ORAL_TABLET | Freq: Two times a day (BID) | ORAL | Status: DC

## 2013-06-16 NOTE — Clinical Documentation Improvement (Signed)
Possible Clinical Conditions?   pancytopenia       Other Condition  Supporting Information: WBC: 2.8 RBC: 2.66; Hgb: 7.1; Hct: 20.8 Plts: 109 Thank You, Joya Salm ,RN Clinical Documentation Specialist:  [place] [employer]

## 2013-06-16 NOTE — Clinical Social Work Note (Signed)
SNF bed offers provided to patient and daughter via phone- they have selected Robert Mueller and I have secured bed for him tomorrow- patient and daughter pleased- daughter [place] will f/u tomorrow for further [place], MSW, [place]

## 2013-06-16 NOTE — Brief Op Note (Signed)
06/10/2013 - 06/12/2013  7:20 AM  PATIENT:  Robert Mueller.  80 y.o. male  PRE-OPERATIVE DIAGNOSIS:  Right femeral neck fracture  POST-OPERATIVE DIAGNOSIS:  Right Femoral Neck Fracture  PROCEDURE:  Procedure(s): ARTHROPLASTY BIPOLAR HIP (Right)  SURGEON:  Surgeon(s) and Role:    * Mauri Pole, MD - Primary  PHYSICIAN ASSISTANT: Danae Orleans, PA-C  ANESTHESIA:   general  EBL:     BLOOD ADMINISTERED:none  DRAINS: none   LOCAL MEDICATIONS USED:  NONE  SPECIMEN:  No Specimen  DISPOSITION OF SPECIMEN:  N/A  COUNTS:  YES  TOURNIQUET:  * No tourniquets in log *  DICTATION: .Other Dictation: Dictation Number [id number]  PLAN OF CARE: Admit to inpatient   PATIENT DISPOSITION:  ICU - intubated and hemodynamically stable.   Delay start of Pharmacological VTE agent (>24hrs) due to surgical blood loss or risk of bleeding: no

## 2013-06-16 NOTE — Discharge Summary (Addendum)
Physician Discharge Summary  Robert Mueller. VOH:[id number] DOB: Jul 18, 1931 DOA: 06/10/2013  PCP: No PCP Per Patient  Admit date: 06/10/2013 Anticipated Discharge date: 06/17/2013  Time spent: 25 minutes  Recommendations for Outpatient Follow-up:  1. Going to [facility] 2. New medications: Iron 325 mg daily x 1 month 3. New medications: OxyIR 5 mg 1-2 pills every 6 hrs as needed for pain 4. New medications: Miralax 17 gm daily as needed for constipation 5. Medication change: Patient's Prozac was substituted with Paxil given Prozac interaction decreasing efficacy of Plavix.  Discharge Diagnoses:  Principal Problem:   Hip fracture:  Active Problems:   S/P CABG (coronary artery bypass graft)   H/O aortic valve replacement   Essential hypertension   Hyperlipidemia   Dementia with Lewy bodies   Acute blood loss anemia   Discharge Condition: Improved, for SNF  Diet recommendation: Heart healthy  Filed Weights   06/10/13 2300  Weight: 78.1 kg (172 lb 2.9 oz)    History of present illness:  Patient with a hx of prior cva, CAD s/p CABG, HTN, AAA, htn, chf who presents to the hospital s/p mechanical fall resulting in hip pain that occurred on 4/3. Pt noted pain in the R hip but did not go to hospital. Instead noted worsening pain and gait, resulting in ED visit on 4/7. In the Ed, CT head was unremarkable. Imaging of the hips however, demonstrated a R subcapital femoral neck fracture. Orthopedic surgery was consulted   Hospital Course:  Principal Problem:   Hip fracture: Following clearance by cardiology, patient underwent right hip hemiarthroplasty on 4/9.  No complications other than expected acute blood loss anemia after surgery. No transfusion required.  He was evaluated by physical therapy who recommended skilled nursing.  Active Problems:   S/P CABG (coronary artery bypass graft): In review from cardiology note of August 2014, patient underwent stress test noting low risk with  inferior scar without ischemia.  History of AAA repair: Done at [facility] with no reported complications. At that time patient had cardiac clearance for much more invasive procedure   H/O aortic valve replacement   Essential hypertension: Stable. Continue on home meds.    Hyperlipidemia: Stable continue on statin    Dementia with Lewy bodies: Stable. Patient is appropriate and interactive. He does have occasional episodes of sundowning, but no severe agitation  Urinary Retention: Patient post procedure had Foley catheter. This was removed and patient was noted to not have any urine output. Bladder scan noted fair amount of urine and Foley catheter reinserted. On 4/13, Foley catheter removed again. The patient has not voided, catheter will be reinserted and patient will be discharged with catheter and plans to follow up with urology as outpatient.    Acute blood loss anemia: Stable. Started on by mouth iron supplementation for the next month. Hemoglobin came to as low as 7.1 on 4/12 morning. However by that afternoon, hemoglobin already increasing. No blood was transfused.  Fever: Patient noted to have a low-grade temperature of 100.6 on 4/11. Workup was unremarkable including negative chest x-ray and urine. No fever since. Suspected to be postop fever  Procedures:  Right hip hemiarthroplasty done 4/9  Consultations:  [facility]-Orthopedic surgery  Discharge Exam: Filed Vitals:   06/16/13 0539  BP: 131/64  Pulse: 74  Temp: 97.9 F (36.6 C)  Resp: 18    General: Alert and oriented x2, no acute distress Cardiovascular: Regular rate and rhythm, I9-S8, 2/6 systolic ejection murmur Respiratory: Clear to auscultation bilaterally  Discharge Instructions You were cared for by a hospitalist during your hospital stay. If you have any questions about your discharge medications or the care you received while you were in the hospital after you are discharged, you can call the unit and asked to speak  with the hospitalist on call if the hospitalist that took care of you is not available. Once you are discharged, your primary care physician will handle any further medical issues. Please note that NO REFILLS for any discharge medications will be authorized once you are discharged, as it is imperative that you return to your primary care physician (or establish a relationship with a primary care physician if you do not have one) for your aftercare needs so that they can reassess your need for medications and monitor your lab values.      Discharge Orders   Future Orders Complete By Expires   Change dressing  As directed    Weight bearing as tolerated  As directed    Questions:     Laterality:  right   Extremity:  Lower       Medication List    STOP taking these medications       FLUoxetine 20 MG capsule  Commonly known as:  PROZAC      TAKE these medications       allopurinol 100 MG tablet  Commonly known as:  ZYLOPRIM  Take 100 mg by mouth 2 (two) times daily. 8am and 1pm     amLODipine 5 MG tablet  Commonly known as:  NORVASC  Take 5 mg by mouth daily.     aspirin EC 81 MG tablet  Take 81 mg by mouth at bedtime.     atorvastatin 20 MG tablet  Commonly known as:  LIPITOR  Take 20 mg by mouth daily.     clopidogrel 75 MG tablet  Commonly known as:  PLAVIX  Take 75 mg by mouth daily.     feeding supplement (ENSURE COMPLETE) Liqd  Take 237 mLs by mouth 2 (two) times daily between meals.     ferrous sulfate 325 (65 FE) MG tablet  Take 1 tablet (325 mg total) by mouth daily with breakfast.     lisinopril 20 MG tablet  Commonly known as:  PRINIVIL,ZESTRIL  Take 20 mg by mouth daily.     megestrol 40 MG/ML suspension  Commonly known as:  MEGACE  Take 200 mg by mouth daily.     Melatonin 3 MG Tabs  Take 9 mg by mouth at bedtime.     metoprolol tartrate 25 MG tablet  Commonly known as:  LOPRESSOR  Take 12.5 mg by mouth 2 (two) times daily. 8am and 1pm      multivitamin-lutein Caps capsule  Take 1 capsule by mouth at bedtime.     oxyCODONE 5 MG immediate release tablet  Commonly known as:  Oxy IR/ROXICODONE  Take 1-2 tablets (5-10 mg total) by mouth 2 (two) times daily as needed for severe pain.     PARoxetine 20 MG tablet  Commonly known as:  PAXIL  Take 1 tablet (20 mg total) by mouth 2 (two) times daily.     polyethylene glycol packet  Commonly known as:  MIRALAX / GLYCOLAX  Take 17 g by mouth daily as needed.     predniSONE 50 MG tablet  Commonly known as:  DELTASONE  Take 50 mg by mouth See admin instructions. Take prior to contrast dye administration:  1 tablet (50 mg) 13  hours before procedure, 1 tablet 7 hours before and 1 tablet 1 hour before        Allergies  Allergen Reactions  . Amoxicillin Other (See Comments)    Joint swelling  . Metrizamide Hives    Hives in 1955 during ivp, OK W/ 13 HR PREP ON 10-15-12//A.CALHOUN  . Penicillins Other (See Comments)    Joint swelling  . Contrast Media [Iodinated Diagnostic Agents] Hives    Hives in 1955 during ivp, OK W/ 13 HR PREP ON 10-15-12//A.CALHOUN   Follow-up Information   Follow up with Mauri Pole, MD. Schedule an appointment as soon as possible for a visit in 2 weeks.   Specialty:  Orthopedic Surgery   Contact information:   [address] [place] [address] [phone]        The results of significant diagnostics from this hospitalization (including imaging, microbiology, ancillary and laboratory) are listed below for reference.    Significant Diagnostic Studies: Dg Chest 1 View  06/10/2013   CLINICAL DATA:  Fall.  Right hip fracture  EXAM: CHEST - 1 VIEW  COMPARISON:  02/24/2013  FINDINGS: Prior cardiac surgery. Aortic stent graft. Mild cardiac enlargement without heart failure. Lungs are clear  IMPRESSION: No active disease.   Electronically Signed   By: Franchot Gallo M.D.   On: 06/10/2013 15:24   Dg Hip Complete Right  06/10/2013    CLINICAL DATA:  Fall  EXAM: RIGHT HIP - COMPLETE 2+ VIEW  COMPARISON:  None.  FINDINGS: Subcapital femoral neck fracture with mild impaction and minimal displacement. Right hip joint appears satisfactory.  Aortobifemoral stent graft placement.  Arterial calcification.  IMPRESSION: Subcapital femoral neck fracture on the right.   Electronically Signed   By: Franchot Gallo M.D.   On: 06/10/2013 15:16   Ct Head Wo Contrast  06/10/2013   CLINICAL DATA:  Headache and neck pain  EXAM: CT HEAD WITHOUT CONTRAST  CT CERVICAL SPINE WITHOUT CONTRAST  TECHNIQUE: Multidetector CT imaging of the head and cervical spine was performed following the standard protocol without intravenous contrast. Multiplanar CT image reconstructions of the cervical spine were also generated.  COMPARISON:  None.  FINDINGS: CT HEAD FINDINGS  Advanced brain atrophy with associated ventricular enlargement. No acute intracranial hemorrhage, definite infarction, mass lesion, midline shift, herniation, or extra-axial fluid collection. No focal mass effect or edema. Normal gray-white matter differentiation. Cisterns patent. Cerebellar atrophy as well. Atherosclerosis of the intracranial vessels. Mastoids and sinuses are clear. No skull abnormality.  CT CERVICAL SPINE FINDINGS  Advanced cervical spondylosis and degenerative change at all levels. C5-6 solid bony fusion noted. No acute fracture, malalignment, subluxation, or dislocation. Diffuse facet arthropathy as well. Facets remain aligned. Normal prevertebral soft tissues. Carotid calcifications noted. No soft tissue asymmetry in the neck. Clear lung apices.  IMPRESSION: Advanced brain atrophy.  No acute intracranial finding  Advanced cervical degenerative changes and spondylosis as described. No acute cervical spine fracture or abnormality by CT.   Electronically Signed   By: Daryll Brod M.D.   On: 06/10/2013 15:41   Ct Cervical Spine Wo Contrast  06/10/2013   CLINICAL DATA:  Headache and neck pain   EXAM: CT HEAD WITHOUT CONTRAST  CT CERVICAL SPINE WITHOUT CONTRAST  TECHNIQUE: Multidetector CT imaging of the head and cervical spine was performed following the standard protocol without intravenous contrast. Multiplanar CT image reconstructions of the cervical spine were also generated.  COMPARISON:  None.  FINDINGS: CT HEAD FINDINGS  Advanced brain atrophy  with associated ventricular enlargement. No acute intracranial hemorrhage, definite infarction, mass lesion, midline shift, herniation, or extra-axial fluid collection. No focal mass effect or edema. Normal gray-white matter differentiation. Cisterns patent. Cerebellar atrophy as well. Atherosclerosis of the intracranial vessels. Mastoids and sinuses are clear. No skull abnormality.  CT CERVICAL SPINE FINDINGS  Advanced cervical spondylosis and degenerative change at all levels. C5-6 solid bony fusion noted. No acute fracture, malalignment, subluxation, or dislocation. Diffuse facet arthropathy as well. Facets remain aligned. Normal prevertebral soft tissues. Carotid calcifications noted. No soft tissue asymmetry in the neck. Clear lung apices.  IMPRESSION: Advanced brain atrophy.  No acute intracranial finding  Advanced cervical degenerative changes and spondylosis as described. No acute cervical spine fracture or abnormality by CT.   Electronically Signed   By: Daryll Brod M.D.   On: 06/10/2013 15:41   Dg Pelvis Portable  06/12/2013   CLINICAL DATA:  Postop change  EXAM: PORTABLE PELVIS 1-2 VIEWS  COMPARISON:  None.  FINDINGS: Changes of recent right hip replacement are noted. No acute abnormality is seen. There are changes consistent with aortic stent graft placement.   Electronically Signed   By: Inez Catalina M.D.   On: 06/12/2013 20:48    Microbiology: Recent Results (from the past 240 hour(s))  MRSA PCR SCREENING     Status: None   Collection Time    06/11/13  6:31 PM      Result Value Ref Range Status   MRSA by PCR NEGATIVE  NEGATIVE Final    Comment:            The GeneXpert MRSA Assay (FDA     approved for NASAL specimens     only), is one component of a     comprehensive MRSA colonization     surveillance program. It is not     intended to diagnose MRSA     infection nor to guide or     monitor treatment for     MRSA infections.     Labs: Basic Metabolic Panel:  Recent Labs Lab 06/10/13 1440 06/10/13 1745 06/11/13 0540 06/13/13 0735 06/14/13 0625 06/15/13 0500  NA 139  --  140 138 136* 136*  K 4.0  --  3.7 4.5 4.4 4.7  CL 104  --  107 103 101 103  CO2 17*  --  19 18* 17* 20  GLUCOSE 89  --  81 90 129* 94  BUN 23  --  22 16 27* 22  CREATININE 0.88 0.76 0.81 1.23 1.34 0.89  CALCIUM 8.9  --  8.3* 8.6 8.2* 8.5  MG  --   --  1.9  --   --   --    Liver Function Tests:  Recent Labs Lab 06/11/13 0540 06/15/13 1735  AST 13 17  ALT 7 9  ALKPHOS 88 135*  BILITOT 0.5 0.6  PROT 5.7* 5.6*  ALBUMIN 2.7* 2.3*   No results found for this basename: LIPASE, AMYLASE,  in the last 168 hours No results found for this basename: AMMONIA,  in the last 168 hours CBC:  Recent Labs Lab 06/10/13 1440 06/10/13 1745 06/11/13 0540 06/13/13 0735 06/14/13 0625 06/15/13 0500 06/15/13 1735  WBC 2.5* 2.3* 2.1* 2.4* 2.9* 2.8*  --   NEUTROABS 0.8*  --   --   --   --   --   --   HGB 11.1* 10.3* 9.7* 8.6* 7.8* 7.1* 7.6*  HCT 32.9* 30.8* 29.2* 26.0* 22.9* 20.8* 22.4*  MCV  80.8 80.6 81.3 80.7 80.4 78.2  --   PLT 167 165 163 204 190 109*  --    Cardiac Enzymes:  Recent Labs Lab 06/10/13 1440 06/14/13 2138  TROPONINI <0.30 <0.30   BNP: BNP (last 3 results) No results found for this basename: PROBNP,  in the last 8760 hours CBG: No results found for this basename: GLUCAP,  in the last 168 hours     Signed:  [facility] 06/16/2013, 8:30 AM

## 2013-06-16 NOTE — Clinical Documentation Improvement (Signed)
Possible Clinical Conditions? Severe Malnutrition Severe Protein Calorie Malnutrition Other Condition Supporting Information: INITIAL NUTRITION ASSESSMENT  DOCUMENTATION CODES  Per approved criteria   -Severe malnutrition in the context of chronic illness   INTERVENTION:  Offer ensure once diet advanced.  NUTRITION DIAGNOSIS:  Malnutrition related to multiple hospitalizations and decreased appetite as evidenced by 8% weight loss x 1 month and poor severe muscle wasting.  Goal:  Pt to meet >/= 90% of their estimated nutrition needs  Monitor:  PO intake, weight trends ASSESSMENT:  Pt admitted from home with hip fx, surgery pending. Pt somewhat confused per staff. Son-in-law provided hx and reports that pt has had several surgeries and been hospitalized for the last 3-4 weeks. Pt has had a decreased appetite in that time contributing to his weight loss.  Nutrition Focused Physical Exam:  Subcutaneous Fat:  Orbital Region: WNL  Upper Arm Region: WNL  Thoracic and Lumbar Region: moderate wasting  Muscle:  Temple Region: WNL  Clavicle Bone Region: moderate wasting  Clavicle and Acromion Bone Region: WNL  Scapular Bone Region: NA  Dorsal Hand: severe wasting  Patellar Region: WNl  Anterior Thigh Region: WNl  Posterior Calf Region: WNl  Edema: not present  Height:  Ht Readings from Last 1 Encounters:   06/10/13  6' (1.829 m)   Weight:  Wt Readings from Last 1 Encounters:   06/10/13  172 lb 2.9 oz (78.1 kg)   Ideal Body Weight: 80.9 kg  % Ideal Body Weight: 97%  Wt Readings from Last 10 Encounters:   06/10/13  172 lb 2.9 oz (78.1 kg)   04/03/13  178 lb (80.74 kg)   02/24/13  200 lb (90.719 kg)   10/17/12  194 lb (87.998 kg)   10/14/12  196 lb (88.905 kg)   10/10/12  194 lb (87.998 kg)   Usual Body Weight: 186 lb% Usual Body Weight: 92%  BMI: Body mass index is 23.35 kg/(m^2).  Estimated Nutritional Needs:  Kcal: 2000-2200  Protein: 85-95 gramsn  Fluid: > 2 L/day Thank  You, Joya Salm ,RN Clinical Documentation Specialist:  [phone]  [place] [employer]-

## 2013-06-16 NOTE — Progress Notes (Signed)
Patient's Foley d/c today around 1200 per order from Dr. Maryland Pink.  Patient without spontaneous void, fluids were encouraged, instructed to bladder scan patient at 1800 and inform Dr. Maryland Pink of results.  Bladder scan at 1800 showed 204.  Spoke with Dr. Maryland Pink,, new order to bladder scan patient at 2200, and if scan showed >300, to re-insert Foley cath.  Nursing will continue to monitor.  Also new order to d/c tele monitoring.

## 2013-06-16 NOTE — Progress Notes (Signed)
Physical Therapy Treatment Patient Details Name: Robert Mueller. MRN: [id number] DOB: Apr 07, 1931 Today's Date: 06/16/2013    History of Present Illness Pt. presented followwing a fall with R femoral neck fx, now s/p R hip hemiarthroplasty.      PT Comments    Pt progressing towards physical therapy goals, requires less assist to transfer today and is tolerating standing for longer periods of time. He is unable to recall any (0/3) posterior hip precautions even at end of therapy session after PT reviewed throughout session. PT will continue to follow until d/c as he will benefit from skilled PT services to increase independence with functional mobility.   Follow Up Recommendations  SNF;Supervision/Assistance - 24 hour     Equipment Recommendations  None recommended by PT    Recommendations for Other Services       Precautions / Restrictions Precautions Precautions: Posterior Hip Precaution Comments: Reeducated pt on 3/3 posterior hip precautions. pt unable to retain education, needs cues throughout session Required Braces or Orthoses: Other Brace/Splint (abduction pillow) Restrictions Weight Bearing Restrictions: Yes RLE Weight Bearing: Weight bearing as tolerated    Mobility  Bed Mobility Overal bed mobility: Needs Assistance;+2 for physical assistance Bed Mobility: Supine to Sit     Supine to sit: Min assist;+2 for physical assistance;HOB elevated     General bed mobility comments: HOB elevated to 30 degrees, pt requires Min assist +2 and max verbal cues for supine>sit. Physical assist for trunk control and to scoot hips forward. Cues needed to initiate movement. Pt requires extra time. Cues and assist given for technique to bring LEs off of bed  Transfers Overall transfer level: Needs assistance Equipment used: Rolling walker (2 wheeled);2 person hand held assist Transfers: Sit to/from [facility] Sit to Stand: Min assist;+2 physical  assistance Stand pivot transfers: Min assist       General transfer comment: Pt requires min assist +2 for sit>stand from lowest bed setting. Cues for technique and hand placement. Pt requested to sit immediately upon standing but encouraged to stand for a while longer. Able to maintain this position and turn safely for extended period of time. Stand>pivot transfer required min assist for sequencing RW and verbal cues for foot placement, pt tolerated standing position well and able to bear some weight through RLE. Pt takes very small steps and requires extra time. Pt states he needed to sit due to LLE fatigue from holding up most of his weight.  Ambulation/Gait                 Stairs            Wheelchair Mobility    Modified Rankin (Stroke Patients Only)       Balance                                    Cognition Arousal/Alertness: Awake/alert Behavior During Therapy: WFL for tasks assessed/performed Overall Cognitive Status: No family/caregiver present to determine baseline cognitive functioning Area of Impairment: Memory;Problem solving;Following commands     Memory: Decreased recall of precautions;Decreased short-term memory Following Commands: Follows one step commands consistently;Follows one step commands with increased time;Follows multi-step commands inconsistently     Problem Solving: Slow processing;Difficulty sequencing;Requires verbal cues      Exercises Total Joint Exercises Ankle Circles/Pumps: AAROM;Both;10 reps;Supine    General Comments General comments (skin integrity, edema, etc.): PT reviewed posterior hip precautions with patient. Unable  to recall any of precautions upon begining and end of therapy session.      Pertinent Vitals/Pain Pt states he is not in any pain at the moment Pt repositioned in chair for comfort. Abduction pillow in place.    Home Living                      Prior Function            PT  Goals (current goals can now be found in the care plan section) Acute Rehab PT Goals PT Goal Formulation: Patient unable to participate in goal setting Time For Goal Achievement: 06/20/13 Potential to Achieve Goals: Fair Progress towards PT goals: Progressing toward goals    Frequency  Min 6X/week    PT Plan Current plan remains appropriate    Co-evaluation             End of Session Equipment Utilized During Treatment: Gait belt Activity Tolerance: Patient tolerated treatment well Patient left: in chair;with call bell/phone within reach     Time: 0959-1016 PT Time Calculation (min): 17 min  Charges:  $Therapeutic Activity: 8-22 mins                    G CodesCamille Bal [place], [place]  Ellouise Newer 06/16/2013, 12:06 PM

## 2013-06-17 DIAGNOSIS — D61818 Other pancytopenia: Secondary | ICD-10-CM

## 2013-06-17 MED ORDER — BOOST / RESOURCE BREEZE PO LIQD
1.0000 | Freq: Three times a day (TID) | ORAL | Status: DC
  Administered 2013-06-17: 1 via ORAL

## 2013-06-17 NOTE — Progress Notes (Signed)
Per order, bladder scan of patient performed.  Bladder scan revealed 186cc of urine.  Pt voided 200cc at [place] at 2214 following bladder scan.  Foley was not reinserted because the bladder scan showed less than 300cc of urine.  Will continue to monitor patient and encourage elimination on a regular basis.

## 2013-06-17 NOTE — Progress Notes (Signed)
Physical Therapy Treatment Patient Details Name: Robert Tippy Jr. MRN: [id number] DOB: 03/28/1931 Today's Date: 06/17/2013    History of Present Illness Pt. presented followwing a fall with R femoral neck fx, now s/p R hip hemiarthroplasty.      PT Comments    Pt progressing toward physical therapy goals ambulating up to 25 feet with min assist. Goals met for bed mobility and is decreasing his burden of care. Pt will benefit from continued skilled PT in SNF to improve his level of independence with functional mobility. Will continue to follow pt until d/c.  Follow Up Recommendations  SNF;Supervision/Assistance - 24 hour     Equipment Recommendations  None recommended by PT    Recommendations for Other Services       Precautions / Restrictions Precautions Precautions: Posterior Hip Precaution Comments: Reeducated pt on 3/3 posterior hip precautions. pt unable to retain education, needs cues throughout session Required Braces or Orthoses: Other Brace/Splint (abduction pillow) Restrictions Weight Bearing Restrictions: Yes RLE Weight Bearing: Weight bearing as tolerated    Mobility  Bed Mobility Overal bed mobility: Needs Assistance Bed Mobility: Supine to Sit     Supine to sit: Min assist     General bed mobility comments: HOB flat with use of rails, pt needs min assist for trunk to rise and to maintain precautions with R hip. Cues for technique  Transfers Overall transfer level: Needs assistance Equipment used: Rolling walker (2 wheeled) Transfers: Sit to/from Stand Sit to Stand: Min assist         General transfer comment: Min assist with sit<>stand from lowest bed setting. Pt states he feels week and needed to sit, but was encouraged to stand for approximately 3 minutes. Pt instructed for foot placement once standing to prevent breaking of hip precautions. Requried seated rest break after 3 minutes standing. Good control with  descent  Ambulation/Gait Ambulation/Gait assistance: Min assist Ambulation Distance (Feet): 25 Feet Assistive device: Rolling walker (2 wheeled) Gait Pattern/deviations: Step-through pattern;Decreased stance time - right;Antalgic;Trunk flexed     General Gait Details: Verbal cues for RW placement. Tends to push too far forward. Cues to use UE's to decrease load on RLE as pt complains of significant pain. Requests often to sit but is able to go a little further if re-directed. Min assist for RW control   Stairs            Wheelchair Mobility    Modified Rankin (Stroke Patients Only)       Balance                                    Cognition Arousal/Alertness: Awake/alert Behavior During Therapy: WFL for tasks assessed/performed Overall Cognitive Status: No family/caregiver present to determine baseline cognitive functioning Area of Impairment: Memory;Problem solving;Following commands     Memory: Decreased recall of precautions;Decreased short-term memory Following Commands: Follows one step commands consistently;Follows one step commands with increased time;Follows multi-step commands inconsistently     Problem Solving: Slow processing;Difficulty sequencing;Requires verbal cues      Exercises Total Joint Exercises Ankle Circles/Pumps: AAROM;Both;10 reps;Supine Long Arc Quad: AROM;Strengthening;Right;10 reps;Seated    General Comments        Pertinent Vitals/Pain Pt states pain at 6/10 - States nurse just gave medicine prior to PT beginning therapy session Pt repositioned in chair for comfort    Home Living                        Prior Function            PT Goals (current goals can now be found in the care plan section) Acute Rehab PT Goals PT Goal Formulation: Patient unable to participate in goal setting Time For Goal Achievement: 06/20/13 Potential to Achieve Goals: Fair Progress towards PT goals: Progressing toward goals     Frequency  Min 6X/week    PT Plan Current plan remains appropriate    Co-evaluation             End of Session Equipment Utilized During Treatment: Gait belt Activity Tolerance: Patient tolerated treatment well Patient left: in chair;with call bell/phone within reach     Time: 1132-1158 PT Time Calculation (min): 26 min  Charges:  $Gait Training: 8-22 mins $Self Care/Home Management: 8-22                    G Codes:      Elayne Snare, [place] 06/17/2013, 12:26 PM

## 2013-06-17 NOTE — Clinical Social Work Note (Signed)
SNF bed accepted at [facility]- patient and family are agreeable to these plans- EMS to transport.  Eduard Clos, MSW, [place]

## 2013-06-17 NOTE — Progress Notes (Signed)
NUTRITION FOLLOW-UP  DOCUMENTATION CODES Per approved criteria  -Severe malnutrition in the context of chronic illness   INTERVENTION: Resource Breeze po TID, each supplement provides 250 kcal and 9 grams of protein  NUTRITION DIAGNOSIS: Malnutrition related to multiple hospitalizations and decreased appetite as evidenced by 8% weight loss x 1 month and poor severe muscle wasting; ongoing.   Goal: Pt to meet >/= 90% of their estimated nutrition needs, not met.   Monitor:  PO intake, weight trends  ASSESSMENT: Pt admitted from home with hip fx, s/p repair 36.   Pt seemed more confused this am. Pt very sleepy, breakfast at bedside untouched.  Unsure of intake of meals, last meal recorded at 50% on 410. Per [facility] documentation pt not taking ensure supplements.   Height: Ht Readings from Last 1 Encounters:  06/10/13 6' (1.829 m)    Weight: Wt Readings from Last 1 Encounters:  06/10/13 172 lb 2.9 oz (78.1 kg)  No new weight  BMI:  Body mass index is 23.35 kg/(m^2).  Estimated Nutritional Needs: Kcal: 2000-2200 Protein: 85-95 gramsn Fluid: > 2 L/day  Skin: old healed PEG site  Diet Order: General   Intake/Output Summary (Last 24 hours) at 06/17/13 0938 Last data filed at 06/17/13 0500  Gross per 24 hour  Intake      0 ml  Output    750 ml  Net   -750 ml    Last BM: 4/14   Labs:   Recent Labs Lab 06/10/13 1440  06/11/13 0540 06/13/13 0735 06/14/13 0625 06/15/13 0500  NA 139  --  140 138 136* 136*  K 4.0  --  3.7 4.5 4.4 4.7  CL 104  --  107 103 101 103  CO2 17*  --  19 18* 17* 20  BUN 23  --  22 16 27* 22  CREATININE 0.88  < > 0.81 1.23 1.34 0.89  CALCIUM 8.9  --  8.3* 8.6 8.2* 8.5  MG  --   --  1.9  --   --   --   GLUCOSE 89  --  81 90 129* 94  < > = values in this interval not displayed.  CBG (last 3)  No results found for this basename: GLUCAP,  in the last 72 hours  Scheduled Meds: . allopurinol  100 mg Oral 2 times per day  . amLODipine   5 mg Oral Daily  . aspirin EC  81 mg Oral QHS  . atorvastatin  20 mg Oral Daily  . clopidogrel  75 mg Oral Daily  . docusate sodium  100 mg Oral BID  . feeding supplement (ENSURE COMPLETE)  237 mL Oral BID BM  . ferrous sulfate  325 mg Oral TID PC  . lisinopril  20 mg Oral Daily  . metoprolol tartrate  12.5 mg Oral BID  .  morphine injection  4 mg Intravenous Once  . multivitamin-lutein  1 capsule Oral QHS  . PARoxetine  20 mg Oral BID  . polyethylene glycol  17 g Oral BID  . sodium chloride  3 mL Intravenous Q12H    Continuous Infusions:   Maylon Peppers RD, [place], CNSC [phone] Pager [phone] After Hours Pager

## 2013-06-17 NOTE — Progress Notes (Signed)
PROGRESS NOTE  Robert Mueller. JJK:[id number] DOB: 1931/05/16 DOA: 06/10/2013 PCP: No PCP Per Patient  Patient with a hx of prior cva, CAD s/p CABG, HTN, AAA, htn, chf who presents to the hospital s/p mechanical fall resulting in hip pain that occurred on 4/3. Pt noted pain in the R hip but did not go to hospital. Instead noted worsening pain and gait, resulting in ED visit today. In the Ed, CT head was unremarkable. Imaging of the hips however, demonstrated a R subcapital femoral neck fracture. Orthopedic surgery was consulted  Assessment/Plan: Hip fracture   Pt is s/p recent AAA repair at [facility] with no reported complications. pt had cardiac clearance for the invasive procedure- will touch base with cardiology here- Dr. Gwenlyn Found from 10/2012 said: Low risk with inferior scar w/o ischemia on stress test Status post right hip hemiarthroplasty on 4/9 and Plavix restarted  Pancytopenia -CBC as outpatient to continue to trend and work up as outpatient  Acute blood loss anemia: Hemoglobin down to 7.1 this morning. Recheck level this afternoon and if lower, we'll transfuse 1 unit packed red blood cells Continue to monitor. On iron supplementation  Fever: resolved  CAD  Asymptomatic EKG unremarkable Currently on plavix - currently holding Cont other home meds  Hx AAA repair  Done about 2 months ago at [place] [facility] statin  HTN  BP stable Cont metoprolol, amlodipine  Dementia -patient is functional  Metabolic acidosis: Unclear etiology maybe starvation ketosis. Has been going on since admission, down to 13 today. We'll still check lactic acid level as well as troponin    Code Status: full Family Communication: no family Disposition Plan: SNF   Consultants:  ortho  Procedures:  Status post right hip hemiarthroplasty done 4/9    HPI/Subjective: Large BM this AM -no need for foley overnight  Objective: Filed Vitals:   06/17/13 0540  BP: 119/65  Pulse: 78  Temp:  98.3 F (36.8 C)  Resp: 18    Intake/Output Summary (Last 24 hours) at 06/17/13 0806 Last data filed at 06/17/13 0500  Gross per 24 hour  Intake      0 ml  Output    750 ml  Net   -750 ml   Filed Weights   06/10/13 2300  Weight: 78.1 kg (172 lb 2.9 oz)    Exam:   General:  Alert and oriented x2, no acute distress  Cardiovascular: Regular rate and rhythm, S1-S2  Respiratory: Clear to auscultation bilaterally  Abdomen: Soft, nontender, nondistended, positive bowel sounds  Musculoskeletal: No clubbing or cyanosis or edema  Data Reviewed: Basic Metabolic Panel:  Recent Labs Lab 06/10/13 1440 06/10/13 1745 06/11/13 0540 06/13/13 0735 06/14/13 0625 06/15/13 0500  NA 139  --  140 138 136* 136*  K 4.0  --  3.7 4.5 4.4 4.7  CL 104  --  107 103 101 103  CO2 17*  --  19 18* 17* 20  GLUCOSE 89  --  81 90 129* 94  BUN 23  --  22 16 27* 22  CREATININE 0.88 0.76 0.81 1.23 1.34 0.89  CALCIUM 8.9  --  8.3* 8.6 8.2* 8.5  MG  --   --  1.9  --   --   --    Liver Function Tests:  Recent Labs Lab 06/11/13 0540 06/15/13 1735  AST 13 17  ALT 7 9  ALKPHOS 88 135*  BILITOT 0.5 0.6  PROT 5.7* 5.6*  ALBUMIN 2.7* 2.3*  No results found for this basename: LIPASE, AMYLASE,  in the last 168 hours No results found for this basename: AMMONIA,  in the last 168 hours CBC:  Recent Labs Lab 06/10/13 1440 06/10/13 1745 06/11/13 0540 06/13/13 0735 06/14/13 0625 06/15/13 0500 06/15/13 1735  WBC 2.5* 2.3* 2.1* 2.4* 2.9* 2.8*  --   NEUTROABS 0.8*  --   --   --   --   --   --   HGB 11.1* 10.3* 9.7* 8.6* 7.8* 7.1* 7.6*  HCT 32.9* 30.8* 29.2* 26.0* 22.9* 20.8* 22.4*  MCV 80.8 80.6 81.3 80.7 80.4 78.2  --   PLT 167 165 163 204 190 109*  --    Cardiac Enzymes:  Recent Labs Lab 06/10/13 1440 06/14/13 2138  TROPONINI <0.30 <0.30   BNP (last 3 results) No results found for this basename: PROBNP,  in the last 8760 hours CBG: No results found for this basename: GLUCAP,  in  the last 168 hours  Recent Results (from the past 240 hour(s))  MRSA PCR SCREENING     Status: None   Collection Time    06/11/13  6:31 PM      Result Value Ref Range Status   MRSA by PCR NEGATIVE  NEGATIVE Final   Comment:            The GeneXpert MRSA Assay (FDA     approved for NASAL specimens     only), is one component of a     comprehensive MRSA colonization     surveillance program. It is not     intended to diagnose MRSA     infection nor to guide or     monitor treatment for     MRSA infections.     Studies: No results found.  Scheduled Meds: . allopurinol  100 mg Oral 2 times per day  . amLODipine  5 mg Oral Daily  . aspirin EC  81 mg Oral QHS  . atorvastatin  20 mg Oral Daily  . clopidogrel  75 mg Oral Daily  . docusate sodium  100 mg Oral BID  . feeding supplement (ENSURE COMPLETE)  237 mL Oral BID BM  . ferrous sulfate  325 mg Oral TID PC  . lisinopril  20 mg Oral Daily  . metoprolol tartrate  12.5 mg Oral BID  .  morphine injection  4 mg Intravenous Once  . multivitamin-lutein  1 capsule Oral QHS  . PARoxetine  20 mg Oral BID  . polyethylene glycol  17 g Oral BID  . sodium chloride  3 mL Intravenous Q12H   Continuous Infusions:   Antibiotics Given (last 72 hours)   None      Principal Problem:   Hip fracture Active Problems:   S/P CABG (coronary artery bypass graft)   H/O aortic valve replacement   Essential hypertension   Hyperlipidemia   Dementia with Lewy bodies   Acute blood loss anemia   Acute urinary retention    Time spent: 25 minutes    Geradine Girt  [facility] Pager [phone] If 7PM-7AM, please contact night-coverage at [email or web address], password [facility] 06/17/2013, 8:06 AM  LOS: 7 days

## 2013-06-17 NOTE — Discharge Summary (Addendum)
Physician Discharge Summary  Robert Mueller. CK:[id number] DOB: [birth year] DOA: 06/10/2013  PCP: No PCP Per Patient  Admit date: 06/10/2013 Anticipated Discharge date: 06/17/2013  Time spent: 25 minutes  Recommendations for Outpatient Follow-up:  1. Going to [facility] 2. New medications: Iron 325 mg daily x 1 month 3. New medications: OxyIR 5 mg 1-2 pills every 6 hrs as needed for pain 4. New medications: Miralax 17 gm daily as needed for constipation 5. Medication change: Patient's Prozac was substituted with Paxil given Prozac interaction decreasing efficacy of Plavix.  Discharge Diagnoses:  Principal Problem:   Hip fracture:  Active Problems:   S/P CABG (coronary artery bypass graft)   H/O aortic valve replacement   Essential hypertension   Hyperlipidemia   Dementia with Lewy bodies   Acute blood loss anemia   Discharge Condition: Improved, for SNF  Diet recommendation: Heart healthy  Filed Weights   06/10/13 2300  Weight: 78.1 kg (172 lb 2.9 oz)    History of present illness:  Patient with a hx of prior cva, CAD s/p CABG, HTN, AAA, htn, chf who presents to the hospital s/p mechanical fall resulting in hip pain that occurred on 4/3. Pt noted pain in the R hip but did not go to hospital. Instead noted worsening pain and gait, resulting in ED visit on 4/7. In the Ed, CT head was unremarkable. Imaging of the hips however, demonstrated a R subcapital femoral neck fracture. Orthopedic surgery was consulted   Hospital Course:  Principal Problem:   Hip fracture: Following clearance by cardiology, patient underwent right hip hemiarthroplasty on 4/9.  No complications other than expected acute blood loss anemia after surgery. No transfusion required.  He was evaluated by physical therapy who recommended skilled nursing.  Active Problems:   S/P CABG (coronary artery bypass graft): In review from cardiology note of August 2014, patient underwent stress test noting low risk with  inferior scar without ischemia.  History of AAA repair: Done at [facility] with no reported complications. At that time patient had cardiac clearance for much more invasive procedure   H/O aortic valve replacement   Essential hypertension: Stable. Continue on home meds.    Hyperlipidemia: Stable continue on statin    Dementia with Lewy bodies: Stable. Patient is appropriate and interactive. He does have occasional episodes of sundowning, but no severe agitation  Urinary Retention: Patient post procedure had Foley catheter. This was removed and patient was noted to not have any urine output. Bladder scan noted fair amount of urine and Foley catheter reinserted. On 4/13, Foley catheter removed again.     Acute blood loss anemia: Stable. Started on by mouth iron supplementation for the next month. Hemoglobin came to as low as 7.1 on 4/12 morning. However by that afternoon, hemoglobin already increasing. No blood was transfused.  Fever: Patient noted to have a low-grade temperature of 100.6 on 4/11. Workup was unremarkable including negative chest x-ray and urine. No fever since. Suspected to be postop fever  Procedures:  Right hip hemiarthroplasty done 4/9  Consultations:  [facility] surgery  Discharge Exam: Filed Vitals:   06/17/13 1252  BP: 125/65  Pulse: 76  Temp: 97.6 F (36.4 C)  Resp: 20    General: Alert and oriented x2, no acute distress Cardiovascular: Regular rate and rhythm, [id number], 2/6 systolic ejection murmur Respiratory: Clear to auscultation bilaterally  Discharge Instructions You were cared for by a hospitalist during your hospital stay. If you have any questions about your discharge medications or  the care you received while you were in the hospital after you are discharged, you can call the unit and asked to speak with the hospitalist on call if the hospitalist that took care of you is not available. Once you are discharged, your primary care physician will handle  any further medical issues. Please note that NO REFILLS for any discharge medications will be authorized once you are discharged, as it is imperative that you return to your primary care physician (or establish a relationship with a primary care physician if you do not have one) for your aftercare needs so that they can reassess your need for medications and monitor your lab values.      Discharge Orders   Future Orders Complete By Expires   Change dressing  As directed    Diet - low sodium heart healthy  As directed    Discharge instructions  As directed    Walk with assistance  As directed    Weight bearing as tolerated  As directed    Questions:     Laterality:  right   Extremity:  Lower       Medication List    STOP taking these medications       FLUoxetine 20 MG capsule  Commonly known as:  PROZAC      TAKE these medications       allopurinol 100 MG tablet  Commonly known as:  ZYLOPRIM  Take 100 mg by mouth 2 (two) times daily. 8am and 1pm     amLODipine 5 MG tablet  Commonly known as:  NORVASC  Take 5 mg by mouth daily.     aspirin EC 81 MG tablet  Take 81 mg by mouth at bedtime.     atorvastatin 20 MG tablet  Commonly known as:  LIPITOR  Take 20 mg by mouth daily.     clopidogrel 75 MG tablet  Commonly known as:  PLAVIX  Take 75 mg by mouth daily.     feeding supplement (ENSURE COMPLETE) Liqd  Take 237 mLs by mouth 2 (two) times daily between meals.     ferrous sulfate 325 (65 FE) MG tablet  Take 1 tablet (325 mg total) by mouth daily with breakfast.     lisinopril 20 MG tablet  Commonly known as:  PRINIVIL,ZESTRIL  Take 20 mg by mouth daily.     megestrol 40 MG/ML suspension  Commonly known as:  MEGACE  Take 200 mg by mouth daily.     Melatonin 3 MG Tabs  Take 9 mg by mouth at bedtime.     metoprolol tartrate 25 MG tablet  Commonly known as:  LOPRESSOR  Take 12.5 mg by mouth 2 (two) times daily. 8am and 1pm     multivitamin-lutein Caps  capsule  Take 1 capsule by mouth at bedtime.     oxyCODONE 5 MG immediate release tablet  Commonly known as:  Oxy IR/ROXICODONE  Take 1-2 tablets (5-10 mg total) by mouth 2 (two) times daily as needed for severe pain.     PARoxetine 20 MG tablet  Commonly known as:  PAXIL  Take 1 tablet (20 mg total) by mouth 2 (two) times daily.     polyethylene glycol packet  Commonly known as:  MIRALAX / GLYCOLAX  Take 17 g by mouth daily as needed.     predniSONE 50 MG tablet  Commonly known as:  DELTASONE  Take 50 mg by mouth See admin instructions. Take prior to contrast dye administration:  1 tablet (50 mg) 13 hours before procedure, 1 tablet 7 hours before and 1 tablet 1 hour before        Allergies  Allergen Reactions  . Amoxicillin Other (See Comments)    Joint swelling  . Metrizamide Hives    Hives in 1955 during ivp, OK W/ 13 HR PREP ON 10-15-12//A.CALHOUN  . Penicillins Other (See Comments)    Joint swelling  . Contrast Media [Iodinated Diagnostic Agents] Hives    Hives in 1955 during ivp, OK W/ 13 HR PREP ON 10-15-12//A.CALHOUN   Follow-up Information   Follow up with Mauri Pole, MD. Schedule an appointment as soon as possible for a visit in 2 weeks.   Specialty:  Orthopedic Surgery   Contact information:   [address] [place] [address] [address] [phone]        The results of significant diagnostics from this hospitalization (including imaging, microbiology, ancillary and laboratory) are listed below for reference.    Significant Diagnostic Studies: Dg Chest 1 View  06/10/2013   CLINICAL DATA:  Fall.  Right hip fracture  EXAM: CHEST - 1 VIEW  COMPARISON:  02/24/2013  FINDINGS: Prior cardiac surgery. Aortic stent graft. Mild cardiac enlargement without heart failure. Lungs are clear  IMPRESSION: No active disease.   Electronically Signed   By: Franchot Gallo M.D.   On: 06/10/2013 15:24   Dg Hip Complete Right  06/10/2013   CLINICAL DATA:  Fall  EXAM:  RIGHT HIP - COMPLETE 2+ VIEW  COMPARISON:  None.  FINDINGS: Subcapital femoral neck fracture with mild impaction and minimal displacement. Right hip joint appears satisfactory.  Aortobifemoral stent graft placement.  Arterial calcification.  IMPRESSION: Subcapital femoral neck fracture on the right.   Electronically Signed   By: Franchot Gallo M.D.   On: 06/10/2013 15:16   Ct Head Wo Contrast  06/10/2013   CLINICAL DATA:  Headache and neck pain  EXAM: CT HEAD WITHOUT CONTRAST  CT CERVICAL SPINE WITHOUT CONTRAST  TECHNIQUE: Multidetector CT imaging of the head and cervical spine was performed following the standard protocol without intravenous contrast. Multiplanar CT image reconstructions of the cervical spine were also generated.  COMPARISON:  None.  FINDINGS: CT HEAD FINDINGS  Advanced brain atrophy with associated ventricular enlargement. No acute intracranial hemorrhage, definite infarction, mass lesion, midline shift, herniation, or extra-axial fluid collection. No focal mass effect or edema. Normal gray-white matter differentiation. Cisterns patent. Cerebellar atrophy as well. Atherosclerosis of the intracranial vessels. Mastoids and sinuses are clear. No skull abnormality.  CT CERVICAL SPINE FINDINGS  Advanced cervical spondylosis and degenerative change at all levels. C5-6 solid bony fusion noted. No acute fracture, malalignment, subluxation, or dislocation. Diffuse facet arthropathy as well. Facets remain aligned. Normal prevertebral soft tissues. Carotid calcifications noted. No soft tissue asymmetry in the neck. Clear lung apices.  IMPRESSION: Advanced brain atrophy.  No acute intracranial finding  Advanced cervical degenerative changes and spondylosis as described. No acute cervical spine fracture or abnormality by CT.   Electronically Signed   By: Daryll Brod M.D.   On: 06/10/2013 15:41   Ct Cervical Spine Wo Contrast  06/10/2013   CLINICAL DATA:  Headache and neck pain  EXAM: CT HEAD WITHOUT  CONTRAST  CT CERVICAL SPINE WITHOUT CONTRAST  TECHNIQUE: Multidetector CT imaging of the head and cervical spine was performed following the standard protocol without intravenous contrast. Multiplanar CT image reconstructions of the cervical spine were also generated.  COMPARISON:  None.  FINDINGS: CT HEAD  FINDINGS  Advanced brain atrophy with associated ventricular enlargement. No acute intracranial hemorrhage, definite infarction, mass lesion, midline shift, herniation, or extra-axial fluid collection. No focal mass effect or edema. Normal gray-white matter differentiation. Cisterns patent. Cerebellar atrophy as well. Atherosclerosis of the intracranial vessels. Mastoids and sinuses are clear. No skull abnormality.  CT CERVICAL SPINE FINDINGS  Advanced cervical spondylosis and degenerative change at all levels. C5-6 solid bony fusion noted. No acute fracture, malalignment, subluxation, or dislocation. Diffuse facet arthropathy as well. Facets remain aligned. Normal prevertebral soft tissues. Carotid calcifications noted. No soft tissue asymmetry in the neck. Clear lung apices.  IMPRESSION: Advanced brain atrophy.  No acute intracranial finding  Advanced cervical degenerative changes and spondylosis as described. No acute cervical spine fracture or abnormality by CT.   Electronically Signed   By: Daryll Brod M.D.   On: 06/10/2013 15:41   Dg Pelvis Portable  06/12/2013   CLINICAL DATA:  Postop change  EXAM: PORTABLE PELVIS 1-2 VIEWS  COMPARISON:  None.  FINDINGS: Changes of recent right hip replacement are noted. No acute abnormality is seen. There are changes consistent with aortic stent graft placement.   Electronically Signed   By: Inez Catalina M.D.   On: 06/12/2013 20:48    Microbiology: Recent Results (from the past 240 hour(s))  MRSA PCR SCREENING     Status: None   Collection Time    06/11/13  6:31 PM      Result Value Ref Range Status   MRSA by PCR NEGATIVE  NEGATIVE Final   Comment:             The GeneXpert MRSA Assay (FDA     approved for NASAL specimens     only), is one component of a     comprehensive MRSA colonization     surveillance program. It is not     intended to diagnose MRSA     infection nor to guide or     monitor treatment for     MRSA infections.     Labs: Basic Metabolic Panel:  Recent Labs Lab 06/10/13 1745 06/11/13 0540 06/13/13 0735 06/14/13 0625 06/15/13 0500  NA  --  140 138 136* 136*  K  --  3.7 4.5 4.4 4.7  CL  --  107 103 101 103  CO2  --  19 18* 17* 20  GLUCOSE  --  81 90 129* 94  BUN  --  22 16 27* 22  CREATININE 0.76 0.81 1.23 1.34 0.89  CALCIUM  --  8.3* 8.6 8.2* 8.5  MG  --  1.9  --   --   --    Liver Function Tests:  Recent Labs Lab 06/11/13 0540 06/15/13 1735  AST 13 17  ALT 7 9  ALKPHOS 88 135*  BILITOT 0.5 0.6  PROT 5.7* 5.6*  ALBUMIN 2.7* 2.3*   No results found for this basename: LIPASE, AMYLASE,  in the last 168 hours No results found for this basename: AMMONIA,  in the last 168 hours CBC:  Recent Labs Lab 06/10/13 1745 06/11/13 0540 06/13/13 0735 06/14/13 0625 06/15/13 0500 06/15/13 1735  WBC 2.3* 2.1* 2.4* 2.9* 2.8*  --   HGB 10.3* 9.7* 8.6* 7.8* 7.1* 7.6*  HCT 30.8* 29.2* 26.0* 22.9* 20.8* 22.4*  MCV 80.6 81.3 80.7 80.4 78.2  --   PLT 165 163 204 190 109*  --    Cardiac Enzymes:  Recent Labs Lab 06/14/13 2138  TROPONINI <0.30   BNP:  BNP (last 3 results) No results found for this basename: PROBNP,  in the last 8760 hours CBG: No results found for this basename: GLUCAP,  in the last 168 hours     Signed:  Geradine Girt  [facility] 06/17/2013, 2:59 PM

## 2013-07-16 ENCOUNTER — Encounter: Payer: Self-pay | Admitting: Neurology

## 2013-07-18 ENCOUNTER — Encounter: Payer: Self-pay | Admitting: Neurology

## 2013-07-18 ENCOUNTER — Ambulatory Visit (INDEPENDENT_AMBULATORY_CARE_PROVIDER_SITE_OTHER): Payer: Medicare Other | Admitting: Neurology

## 2013-07-18 VITALS — BP 117/63 | HR 76 | Ht 72.0 in

## 2013-07-18 DIAGNOSIS — R269 Unspecified abnormalities of gait and mobility: Secondary | ICD-10-CM

## 2013-07-18 DIAGNOSIS — M545 Low back pain, unspecified: Secondary | ICD-10-CM

## 2013-07-18 DIAGNOSIS — D518 Other vitamin B12 deficiency anemias: Secondary | ICD-10-CM

## 2013-07-18 DIAGNOSIS — R6889 Other general symptoms and signs: Secondary | ICD-10-CM

## 2013-07-18 DIAGNOSIS — R413 Other amnesia: Secondary | ICD-10-CM

## 2013-07-18 HISTORY — DX: Unspecified abnormalities of gait and mobility: R26.9

## 2013-07-18 HISTORY — DX: Other amnesia: R41.3

## 2013-07-18 MED ORDER — DONEPEZIL HCL 5 MG PO TABS: 5.0000 mg | ORAL_TABLET | Freq: Every day | ORAL | Status: AC

## 2013-07-18 NOTE — Progress Notes (Signed)
Reason for visit: Memory problems, gait disorder  Robert Mueller. is a 80 y.o. male  History of present illness:  Robert Mueller is a 80 year old left-handed white male with a history of a memory disorder that dates back to around 2005 according to the daughter. The patient had a slowly progressive memory loss over that period time that became much more significant over the last year or 2. He was living in [place] with his wife, but his wife has passed away, and he has now moved back to be with the daughter. The walking issues has been present for least one year, but the daughter believes it may have been there longer than that. He was using a cane, and then graduated to a walker. He fell while using a walker recently, and fractured his right hip. The patient has not been able to walk since that time, and he has a tendency to lean backwards making ambulation ineffective. The currently is in [facility] facility for rehabilitation. The patient will have good and bad days with his functional level. He recently has had some significant swelling in both legs below the knee. He has had about a 25 pound weight loss since December 2014. He has had a recent CT scan of the brain and CT scan of the cervical spine. Head scan revealed evidence of diffuse cortical atrophy with hydrocephalus ex vacuo. The patient complains of low back pain on a daily basis, but he denies pain radiating down the legs. He is sent to this office for an evaluation.  Past Medical History  Diagnosis Date  . Coronary artery disease   . S/P CABG (coronary artery bypass graft)     x 2  . Hypertension   . Hyperlipidemia   . Abdominal aortic aneurysm     7.3 cm x 7.4 cm  . Stroke   . Cancer     prostate  . Stomach problems   . Arthritis     Gout  . GERD (gastroesophageal reflux disease)   . Mild aortic stenosis   . CHF (congestive heart failure)   . Chronic kidney disease     Stage II  . Memory deficit 07/18/2013  .  Abnormality of gait 07/18/2013  . Gout     Past Surgical History  Procedure Laterality Date  . Hernia repair  2014    4  . Replacement total knee  2012  . Prostatectomy  2010  . Coronary artery bypass graft  1994  . Bypass graft  2011  . Hip arthroplasty Right 06/12/2013    Procedure: ARTHROPLASTY BIPOLAR HIP;  Surgeon: Mauri Pole, MD;  Location: [facility];  Service: Orthopedics;  Laterality: Right;  . Abdominal aortic aneurysm repair      Family History  Problem Relation Age of Onset  . Cirrhosis Neg Hx     family member passed away at 40  . Cancer Father   . Alcoholism Brother   . Dementia Mother     Social history:  reports that he quit smoking about 25 years ago. His smoking use included Cigars. He has quit using smokeless tobacco. His smokeless tobacco use included Chew. He reports that he drinks alcohol. He reports that he does not use illicit drugs.  Medications:  Current Outpatient Prescriptions on File Prior to Visit  Medication Sig Dispense Refill  . allopurinol (ZYLOPRIM) 100 MG tablet Take 100 mg by mouth 2 (two) times daily. 8am and 1pm      .  amLODipine (NORVASC) 5 MG tablet Take 5 mg by mouth daily.       Marland Kitchen aspirin EC 81 MG tablet Take 81 mg by mouth at bedtime.       Marland Kitchen atorvastatin (LIPITOR) 20 MG tablet Take 20 mg by mouth daily.       . clopidogrel (PLAVIX) 75 MG tablet Take 75 mg by mouth daily.       . feeding supplement, ENSURE COMPLETE, (ENSURE COMPLETE) LIQD Take 237 mLs by mouth 2 (two) times daily between meals.  60 Bottle  1  . ferrous sulfate 325 (65 FE) MG tablet Take 1 tablet (325 mg total) by mouth daily with breakfast.  30 tablet  0  . FLUoxetine (PROZAC) 20 MG capsule Take 20 mg by mouth daily.      Marland Kitchen lisinopril (PRINIVIL,ZESTRIL) 20 MG tablet Take 20 mg by mouth daily.       . megestrol (MEGACE) 40 MG/ML suspension Take 200 mg by mouth daily.       . Melatonin 3 MG TABS Take 12 mg by mouth at bedtime.       . metoprolol tartrate (LOPRESSOR) 25  MG tablet Take 12.5 mg by mouth 2 (two) times daily. 8am and 1pm      . multivitamin-lutein (OCUVITE-LUTEIN) CAPS capsule Take 1 capsule by mouth at bedtime.       Marland Kitchen oxyCODONE (OXY IR/ROXICODONE) 5 MG immediate release tablet Take 1-2 tablets (5-10 mg total) by mouth 2 (two) times daily as needed for severe pain.  80 tablet  0  . polyethylene glycol (MIRALAX / GLYCOLAX) packet Take 17 g by mouth daily as needed.  14 each  0  . predniSONE (DELTASONE) 50 MG tablet Take 50 mg by mouth See admin instructions. Take prior to contrast dye administration:  1 tablet (50 mg) 13 hours before procedure, 1 tablet 7 hours before and 1 tablet 1 hour before       No current facility-administered medications on file prior to visit.      Allergies  Allergen Reactions  . Amoxicillin Other (See Comments)    Joint swelling  . Metrizamide Hives    Hives in 1955 during ivp, OK W/ 13 HR PREP ON 10-15-12//A.CALHOUN  . Penicillins Other (See Comments)    Joint swelling  . Contrast Media [Iodinated Diagnostic Agents] Hives    Hives in 1955 during ivp, OK W/ 13 HR PREP ON 10-15-12//A.CALHOUN    ROS:  Out of a complete 14 system review of symptoms, the patient complains only of the following symptoms, and all other reviewed systems are negative.  Fatigue Joint pain, back pain, walking difficulties, coordination problems Memory loss, weakness, depression  Blood pressure 117/63, pulse 76, height 6' (1.829 m), weight 0 lb (0 kg).  Physical Exam  General: The patient is alert and cooperative at the time of the examination. The patient is somewhat sleepy at the time of the examination.  Eyes: Pupils are equal, round, and reactive to light. Discs are flat bilaterally.  Neck: The neck is supple, no carotid bruits are noted.  Respiratory: The respiratory examination is clear.  Cardiovascular: The cardiovascular examination reveals a regular rate and rhythm, no obvious murmurs or rubs are noted.  Skin:  Extremities are with 3+ edema below the knees bilaterally.  Neurologic Exam  Mental status: The Mini-Mental status examination done today shows a total score of 18/30.  Cranial nerves: Facial symmetry is present. There is good sensation of the face to pinprick and  soft touch bilaterally. The strength of the facial muscles and the muscles to head turning and shoulder shrug are normal bilaterally. Speech is well enunciated, no aphasia or dysarthria is noted. Extraocular movements are full. Visual fields are full. The tongue is midline, and the patient has symmetric elevation of the soft palate. No obvious hearing deficits are noted.  Motor: The motor testing reveals 5 over 5 strength of all 4 extremities, with exception that he cannot flex at the right hip well. Good symmetric motor tone is noted throughout.  Sensory: Sensory testing is intact to pinprick, soft touch, vibration sensation, and position sense on all 4 extremities, with exception that the patient has poor responses to position sense testing, may be perseverating. No evidence of extinction is noted.  Coordination: Cerebellar testing reveals good finger-nose-finger and heel-to-shin bilaterally, with exception that he has difficulty performing heel-to-shin with the right leg, mobility issues with the right hip.  Gait and station: The patient requires assistance with standing, has a tendency to lean backwards, stance is wide-based, he is not able to effectively ambulate.  Reflexes: Deep tendon reflexes are symmetric bilaterally. Knee jerk reflexes are somewhat brisk bilaterally. Toes are downgoing bilaterally.   CT brain, cervical spine 06/10/2013:  IMPRESSION:  Advanced brain atrophy. No acute intracranial finding  Advanced cervical degenerative changes and spondylosis as described.  No acute cervical spine fracture or abnormality by CT.    Assessment/Plan:  1. Progressive dementia  2. Gait disorder  The patient is  functionally nonambulatory, with a tendency to lean backwards. The patient has had a prolonged history of a progressive memory disorder dating back at least 10 years. He will be sent for blood work today, and a CT scan of the lumbosacral spine will be done. Low back issues could be a contributing factor to his walking problems. The patient will be placed on low-dose Aricept, and he will followup in about 4 months.  Jill Alexanders MD 07/18/2013 3:02 PM  [facility] [address] [place], [place] [address]  Phone [phone] Fax [phone]

## 2013-07-18 NOTE — Patient Instructions (Signed)

## 2013-07-19 LAB — VITAMIN B12: Vitamin B-12: 232 pg/mL (ref 211–946)

## 2013-07-19 LAB — RPR: RPR: NONREACTIVE

## 2013-07-19 LAB — COPPER, SERUM: Copper: 166 ug/dL (ref 72–166)

## 2013-07-19 LAB — TSH: TSH: 1.5 u[IU]/mL (ref 0.450–4.500)

## 2013-07-21 ENCOUNTER — Telehealth: Payer: Self-pay | Admitting: *Deleted

## 2013-07-21 NOTE — Telephone Encounter (Signed)
Read to her last notes plan assessment.  Labs drawn and done.  CT ordered.  Will fax to them for there records.   DONE.

## 2015-10-24 IMAGING — CR DG CHEST 1V
2 series · 2 of 2 positions shown · non-contrast
Comparison: 02/24/2013

CLINICAL DATA: Fall.  Right hip fracture

EXAM:
CHEST - 1 VIEW

[t chest supine (1 of 2)]
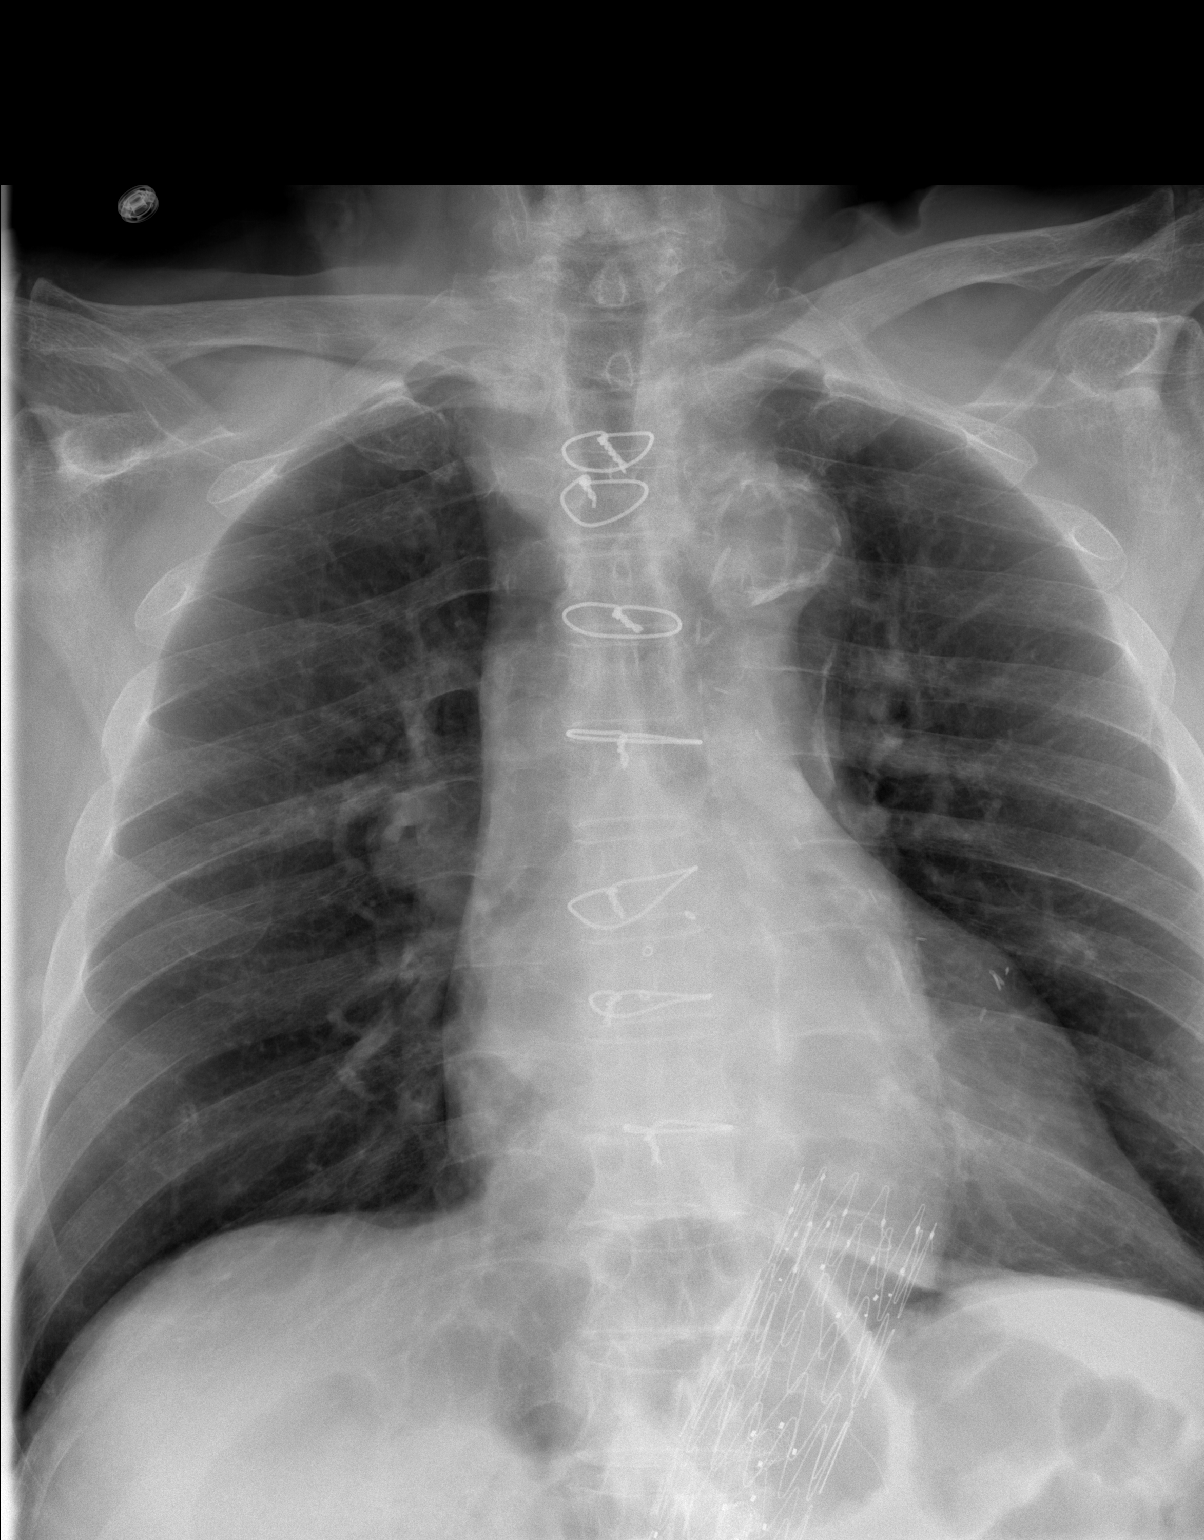

[t chest supine (2 of 2)]
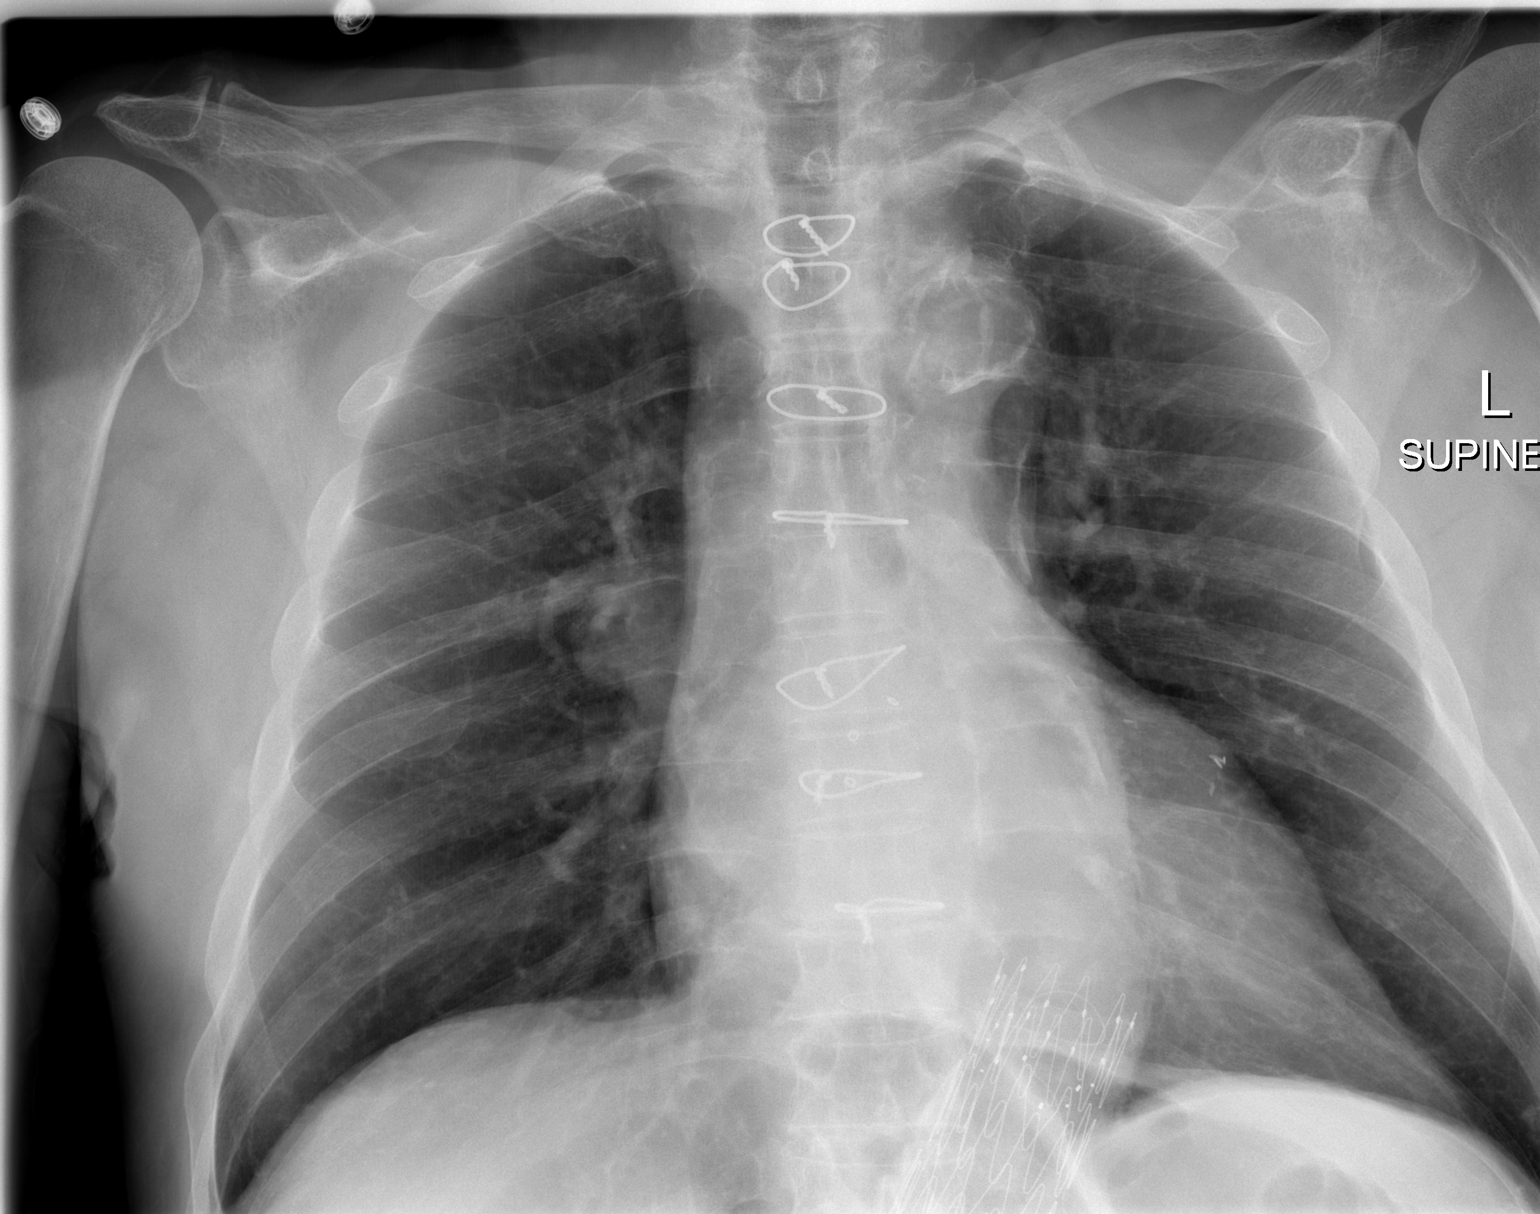

[2 of 2 positions shown; findings below may reference images not displayed]

FINDINGS: Prior cardiac surgery. Aortic stent graft. Mild cardiac enlargement
without heart failure. Lungs are clear
IMPRESSION: No active disease.

## 2015-10-26 IMAGING — CR DG PORTABLE PELVIS
1 series · 1 of 1 positions shown · non-contrast
Comparison: None.

CLINICAL DATA: Postop change

EXAM:
PORTABLE PELVIS 1-2 VIEWS

[AP]
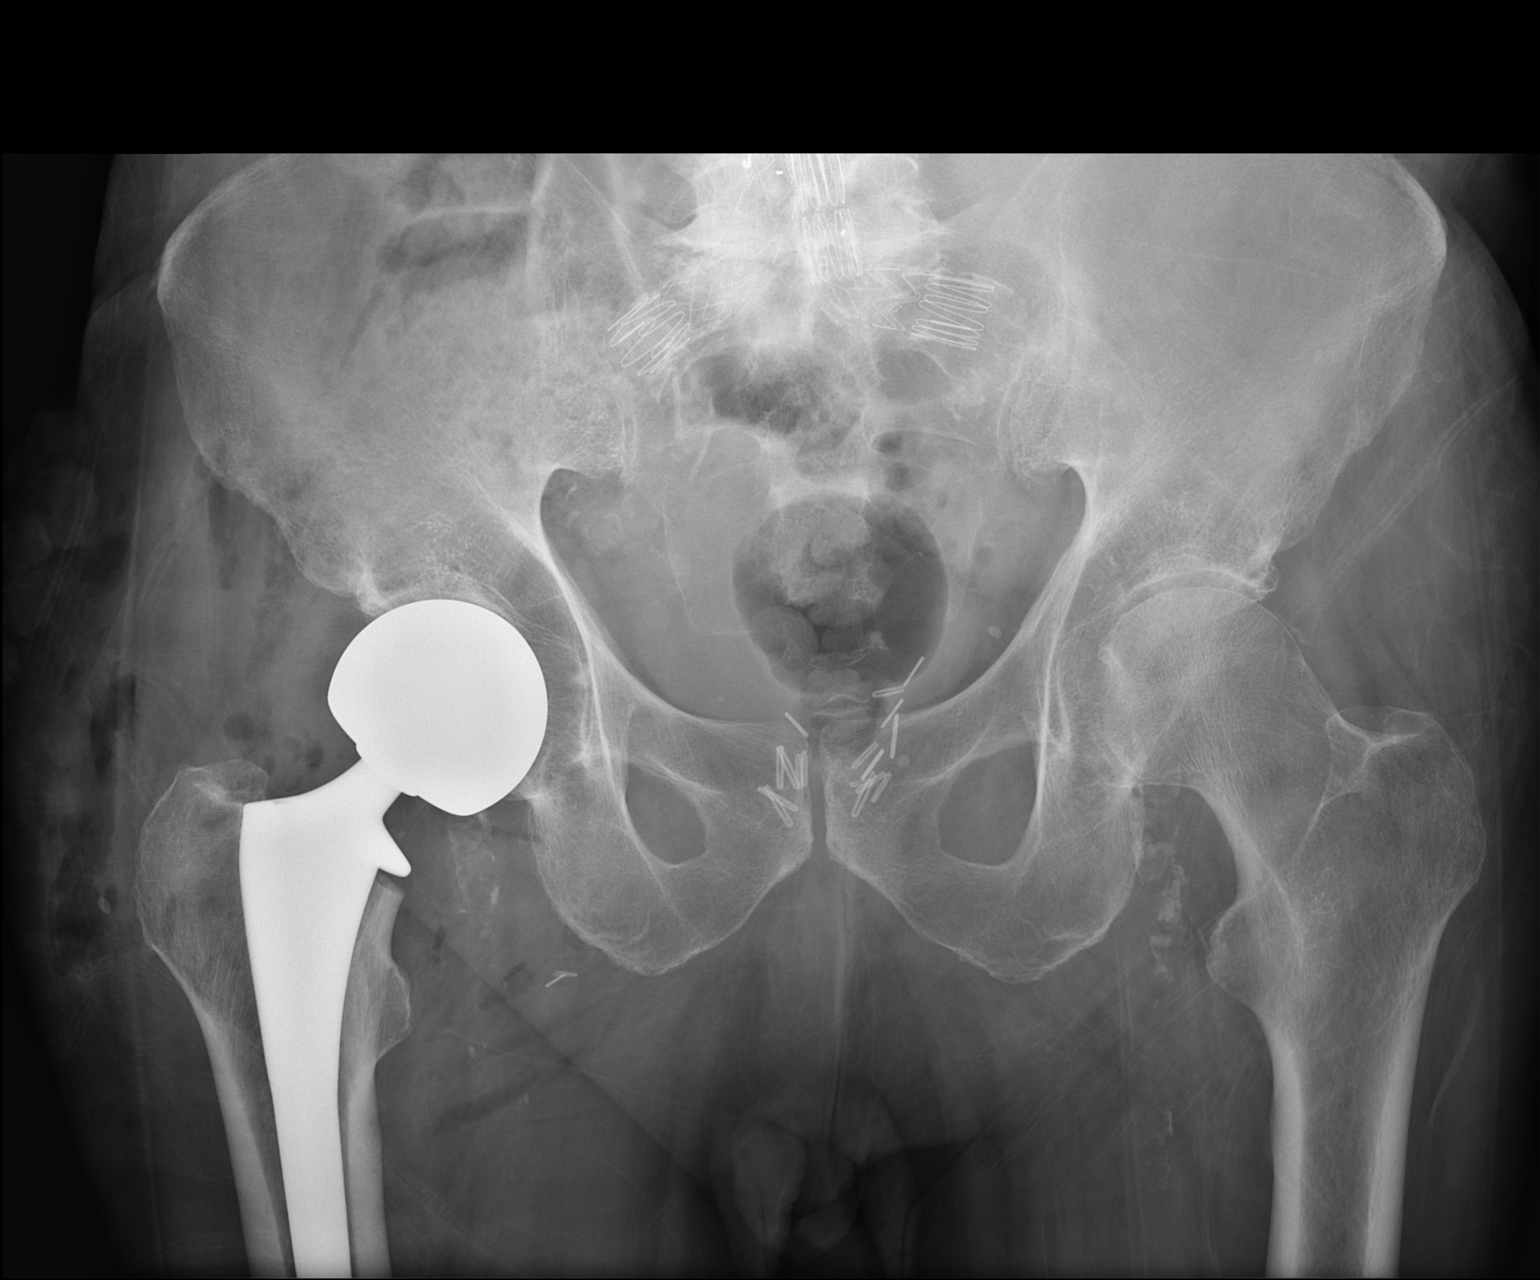

[1 of 1 positions shown; findings below may reference images not displayed]

FINDINGS: Changes of recent right hip replacement are noted. No acute
abnormality is seen. There are changes consistent with aortic stent
graft placement.

## 2019-04-07 DEATH — deceased
